# Patient Record
Sex: Male | Born: 1935 | Race: White | Hispanic: No | Marital: Married | Smoking: Never smoker
Health system: Southern US, Community
[De-identification: ages and names within clinical notes are randomized; demographics above are authoritative.]

## PROBLEM LIST (undated history)

## (undated) DIAGNOSIS — F419 Anxiety disorder, unspecified: Secondary | ICD-10-CM

## (undated) DIAGNOSIS — I2699 Other pulmonary embolism without acute cor pulmonale: Secondary | ICD-10-CM

## (undated) DIAGNOSIS — M75100 Unspecified rotator cuff tear or rupture of unspecified shoulder, not specified as traumatic: Secondary | ICD-10-CM

## (undated) DIAGNOSIS — N289 Disorder of kidney and ureter, unspecified: Secondary | ICD-10-CM

## (undated) DIAGNOSIS — M48061 Spinal stenosis, lumbar region without neurogenic claudication: Secondary | ICD-10-CM

## (undated) DIAGNOSIS — E785 Hyperlipidemia, unspecified: Secondary | ICD-10-CM

## (undated) DIAGNOSIS — T4145XA Adverse effect of unspecified anesthetic, initial encounter: Secondary | ICD-10-CM

## (undated) DIAGNOSIS — K649 Unspecified hemorrhoids: Secondary | ICD-10-CM

## (undated) DIAGNOSIS — F32A Depression, unspecified: Secondary | ICD-10-CM

## (undated) DIAGNOSIS — K219 Gastro-esophageal reflux disease without esophagitis: Secondary | ICD-10-CM

## (undated) DIAGNOSIS — I219 Acute myocardial infarction, unspecified: Secondary | ICD-10-CM

## (undated) DIAGNOSIS — Z87442 Personal history of urinary calculi: Secondary | ICD-10-CM

## (undated) DIAGNOSIS — F329 Major depressive disorder, single episode, unspecified: Secondary | ICD-10-CM

## (undated) DIAGNOSIS — R9431 Abnormal electrocardiogram [ECG] [EKG]: Secondary | ICD-10-CM

## (undated) DIAGNOSIS — T8859XA Other complications of anesthesia, initial encounter: Secondary | ICD-10-CM

## (undated) DIAGNOSIS — I10 Essential (primary) hypertension: Secondary | ICD-10-CM

## (undated) DIAGNOSIS — C61 Malignant neoplasm of prostate: Secondary | ICD-10-CM

## (undated) DIAGNOSIS — F0390 Unspecified dementia without behavioral disturbance: Secondary | ICD-10-CM

## (undated) DIAGNOSIS — Z89612 Acquired absence of left leg above knee: Secondary | ICD-10-CM

## (undated) DIAGNOSIS — M4846XA Fatigue fracture of vertebra, lumbar region, initial encounter for fracture: Secondary | ICD-10-CM

## (undated) HISTORY — PX: OTHER SURGICAL HISTORY: SHX169

## (undated) HISTORY — DX: Major depressive disorder, single episode, unspecified: F32.9

## (undated) HISTORY — PX: EYE SURGERY: SHX253

## (undated) HISTORY — DX: Malignant neoplasm of prostate: C61

## (undated) HISTORY — PX: HEMORRHOID SURGERY: SHX153

## (undated) HISTORY — PX: CHOLECYSTECTOMY: SHX55

## (undated) HISTORY — DX: Acute myocardial infarction, unspecified: I21.9

## (undated) HISTORY — PX: HERNIA REPAIR: SHX51

## (undated) HISTORY — PX: JOINT REPLACEMENT: SHX530

## (undated) HISTORY — DX: Depression, unspecified: F32.A

## (undated) HISTORY — DX: Hyperlipidemia, unspecified: E78.5

---

## 1965-08-31 DIAGNOSIS — Z89612 Acquired absence of left leg above knee: Secondary | ICD-10-CM

## 1965-08-31 HISTORY — PX: AMPUTATION: SHX166

## 1965-08-31 HISTORY — DX: Acquired absence of left leg above knee: Z89.612

## 1993-08-31 HISTORY — PX: PROSTATECTOMY: SHX69

## 1993-09-01 DIAGNOSIS — C61 Malignant neoplasm of prostate: Secondary | ICD-10-CM

## 1993-09-01 HISTORY — DX: Malignant neoplasm of prostate: C61

## 2005-04-20 ENCOUNTER — Inpatient Hospital Stay: Payer: Self-pay | Admitting: General Practice

## 2005-04-20 HISTORY — PX: OTHER SURGICAL HISTORY: SHX169

## 2005-04-25 ENCOUNTER — Encounter: Payer: Self-pay | Admitting: Internal Medicine

## 2005-05-01 ENCOUNTER — Encounter: Payer: Self-pay | Admitting: Internal Medicine

## 2005-05-31 ENCOUNTER — Encounter: Payer: Self-pay | Admitting: Internal Medicine

## 2005-07-01 ENCOUNTER — Encounter: Payer: Self-pay | Admitting: Internal Medicine

## 2005-07-31 ENCOUNTER — Encounter: Payer: Self-pay | Admitting: Internal Medicine

## 2005-08-31 ENCOUNTER — Encounter: Payer: Self-pay | Admitting: Internal Medicine

## 2005-10-01 ENCOUNTER — Encounter: Payer: Self-pay | Admitting: Internal Medicine

## 2005-10-29 ENCOUNTER — Encounter: Payer: Self-pay | Admitting: Internal Medicine

## 2005-11-29 ENCOUNTER — Encounter: Payer: Self-pay | Admitting: Internal Medicine

## 2005-12-29 ENCOUNTER — Encounter: Payer: Self-pay | Admitting: Internal Medicine

## 2006-01-29 ENCOUNTER — Encounter: Payer: Self-pay | Admitting: Internal Medicine

## 2006-02-28 ENCOUNTER — Encounter: Payer: Self-pay | Admitting: Internal Medicine

## 2006-03-31 ENCOUNTER — Encounter: Payer: Self-pay | Admitting: Internal Medicine

## 2006-05-01 ENCOUNTER — Encounter: Payer: Self-pay | Admitting: Internal Medicine

## 2006-05-11 ENCOUNTER — Emergency Department: Payer: Self-pay | Admitting: Emergency Medicine

## 2006-05-31 ENCOUNTER — Encounter: Payer: Self-pay | Admitting: Internal Medicine

## 2006-07-01 ENCOUNTER — Encounter: Payer: Self-pay | Admitting: Internal Medicine

## 2007-07-12 ENCOUNTER — Ambulatory Visit: Payer: Self-pay | Admitting: General Practice

## 2007-08-03 ENCOUNTER — Inpatient Hospital Stay: Payer: Self-pay | Admitting: General Practice

## 2007-08-04 ENCOUNTER — Other Ambulatory Visit: Payer: Self-pay

## 2007-08-05 ENCOUNTER — Other Ambulatory Visit: Payer: Self-pay

## 2007-08-10 ENCOUNTER — Encounter: Payer: Self-pay | Admitting: Internal Medicine

## 2007-08-21 ENCOUNTER — Other Ambulatory Visit: Payer: Self-pay

## 2007-08-21 ENCOUNTER — Inpatient Hospital Stay: Payer: Self-pay | Admitting: Internal Medicine

## 2008-08-03 ENCOUNTER — Ambulatory Visit: Payer: Self-pay | Admitting: General Practice

## 2008-08-31 DIAGNOSIS — M4846XA Fatigue fracture of vertebra, lumbar region, initial encounter for fracture: Secondary | ICD-10-CM

## 2008-08-31 HISTORY — DX: Fatigue fracture of vertebra, lumbar region, initial encounter for fracture: M48.46XA

## 2008-09-10 ENCOUNTER — Ambulatory Visit: Payer: Self-pay | Admitting: Anesthesiology

## 2008-10-01 ENCOUNTER — Ambulatory Visit: Payer: Self-pay | Admitting: Anesthesiology

## 2008-10-24 ENCOUNTER — Ambulatory Visit: Payer: Self-pay | Admitting: Anesthesiology

## 2008-12-13 ENCOUNTER — Ambulatory Visit: Payer: Self-pay | Admitting: Internal Medicine

## 2009-01-18 ENCOUNTER — Ambulatory Visit: Payer: Self-pay | Admitting: Anesthesiology

## 2009-03-26 ENCOUNTER — Ambulatory Visit: Payer: Self-pay | Admitting: Anesthesiology

## 2009-05-28 ENCOUNTER — Ambulatory Visit: Payer: Self-pay | Admitting: Anesthesiology

## 2011-03-31 ENCOUNTER — Encounter: Payer: Self-pay | Admitting: Physical Therapy

## 2011-04-02 ENCOUNTER — Ambulatory Visit: Payer: Medicare Other | Attending: Orthopedic Surgery | Admitting: Physical Therapy

## 2011-04-02 DIAGNOSIS — R5381 Other malaise: Secondary | ICD-10-CM | POA: Insufficient documentation

## 2011-04-02 DIAGNOSIS — IMO0001 Reserved for inherently not codable concepts without codable children: Secondary | ICD-10-CM | POA: Insufficient documentation

## 2011-04-02 DIAGNOSIS — R269 Unspecified abnormalities of gait and mobility: Secondary | ICD-10-CM | POA: Insufficient documentation

## 2011-04-09 ENCOUNTER — Encounter: Payer: Medicare Other | Admitting: Physical Therapy

## 2011-04-10 ENCOUNTER — Ambulatory Visit: Payer: Medicare Other | Admitting: Physical Therapy

## 2011-04-15 ENCOUNTER — Ambulatory Visit: Payer: Medicare Other | Admitting: Physical Therapy

## 2011-04-21 ENCOUNTER — Encounter: Payer: Medicare Other | Admitting: Physical Therapy

## 2011-04-23 ENCOUNTER — Ambulatory Visit: Payer: Medicare Other | Admitting: Physical Therapy

## 2011-04-28 ENCOUNTER — Encounter: Payer: Medicare Other | Admitting: Physical Therapy

## 2011-04-30 ENCOUNTER — Encounter: Payer: Medicare Other | Admitting: Physical Therapy

## 2011-05-05 ENCOUNTER — Encounter: Payer: Medicare Other | Admitting: Physical Therapy

## 2011-05-07 ENCOUNTER — Encounter: Payer: Medicare Other | Admitting: Physical Therapy

## 2011-05-12 ENCOUNTER — Ambulatory Visit: Payer: Medicare Other | Attending: Orthopedic Surgery | Admitting: Physical Therapy

## 2011-05-12 DIAGNOSIS — R5381 Other malaise: Secondary | ICD-10-CM | POA: Insufficient documentation

## 2011-05-12 DIAGNOSIS — S78119A Complete traumatic amputation at level between unspecified hip and knee, initial encounter: Secondary | ICD-10-CM | POA: Insufficient documentation

## 2011-05-12 DIAGNOSIS — IMO0001 Reserved for inherently not codable concepts without codable children: Secondary | ICD-10-CM | POA: Insufficient documentation

## 2011-05-12 DIAGNOSIS — R269 Unspecified abnormalities of gait and mobility: Secondary | ICD-10-CM | POA: Insufficient documentation

## 2011-05-19 ENCOUNTER — Ambulatory Visit: Payer: Medicare Other | Admitting: Physical Therapy

## 2011-05-26 ENCOUNTER — Ambulatory Visit: Payer: Medicare Other | Admitting: Physical Therapy

## 2011-06-02 ENCOUNTER — Ambulatory Visit: Payer: Medicare Other | Attending: Orthopedic Surgery | Admitting: Physical Therapy

## 2011-06-02 DIAGNOSIS — S78119A Complete traumatic amputation at level between unspecified hip and knee, initial encounter: Secondary | ICD-10-CM | POA: Insufficient documentation

## 2011-06-02 DIAGNOSIS — R269 Unspecified abnormalities of gait and mobility: Secondary | ICD-10-CM | POA: Insufficient documentation

## 2011-06-02 DIAGNOSIS — IMO0001 Reserved for inherently not codable concepts without codable children: Secondary | ICD-10-CM | POA: Insufficient documentation

## 2011-06-02 DIAGNOSIS — R5381 Other malaise: Secondary | ICD-10-CM | POA: Insufficient documentation

## 2011-06-16 ENCOUNTER — Ambulatory Visit: Payer: Medicare Other | Admitting: Physical Therapy

## 2011-06-30 ENCOUNTER — Ambulatory Visit: Payer: Medicare Other | Admitting: Physical Therapy

## 2011-07-14 ENCOUNTER — Ambulatory Visit: Payer: Medicare Other | Attending: Orthopedic Surgery | Admitting: Physical Therapy

## 2011-07-14 DIAGNOSIS — R269 Unspecified abnormalities of gait and mobility: Secondary | ICD-10-CM | POA: Insufficient documentation

## 2011-07-14 DIAGNOSIS — IMO0001 Reserved for inherently not codable concepts without codable children: Secondary | ICD-10-CM | POA: Insufficient documentation

## 2011-07-14 DIAGNOSIS — R5381 Other malaise: Secondary | ICD-10-CM | POA: Insufficient documentation

## 2011-07-14 DIAGNOSIS — S78119A Complete traumatic amputation at level between unspecified hip and knee, initial encounter: Secondary | ICD-10-CM | POA: Insufficient documentation

## 2011-07-29 ENCOUNTER — Encounter: Payer: Medicare Other | Admitting: Physical Therapy

## 2011-08-04 ENCOUNTER — Encounter: Payer: Medicare Other | Admitting: Physical Therapy

## 2013-01-18 ENCOUNTER — Ambulatory Visit: Payer: Self-pay | Admitting: Gastroenterology

## 2013-01-26 LAB — PATHOLOGY REPORT

## 2013-12-27 DIAGNOSIS — I10 Essential (primary) hypertension: Secondary | ICD-10-CM | POA: Insufficient documentation

## 2013-12-27 DIAGNOSIS — M4850XA Collapsed vertebra, not elsewhere classified, site unspecified, initial encounter for fracture: Secondary | ICD-10-CM | POA: Insufficient documentation

## 2013-12-27 DIAGNOSIS — K219 Gastro-esophageal reflux disease without esophagitis: Secondary | ICD-10-CM | POA: Insufficient documentation

## 2014-04-05 DIAGNOSIS — M48061 Spinal stenosis, lumbar region without neurogenic claudication: Secondary | ICD-10-CM | POA: Insufficient documentation

## 2014-09-25 DIAGNOSIS — Z8546 Personal history of malignant neoplasm of prostate: Secondary | ICD-10-CM | POA: Insufficient documentation

## 2015-12-25 ENCOUNTER — Ambulatory Visit
Admission: EM | Admit: 2015-12-25 | Discharge: 2015-12-25 | Disposition: A | Discharge status: Home / Self Care | Payer: Medicare Other | Attending: Family Medicine | Admitting: Family Medicine

## 2015-12-25 ENCOUNTER — Ambulatory Visit: Payer: Medicare Other

## 2015-12-25 DIAGNOSIS — S2243XA Multiple fractures of ribs, bilateral, initial encounter for closed fracture: Secondary | ICD-10-CM | POA: Diagnosis not present

## 2015-12-25 DIAGNOSIS — M4856XA Collapsed vertebra, not elsewhere classified, lumbar region, initial encounter for fracture: Secondary | ICD-10-CM | POA: Insufficient documentation

## 2015-12-25 DIAGNOSIS — X58XXXA Exposure to other specified factors, initial encounter: Secondary | ICD-10-CM | POA: Diagnosis not present

## 2015-12-25 DIAGNOSIS — R1013 Epigastric pain: Secondary | ICD-10-CM | POA: Diagnosis not present

## 2015-12-25 DIAGNOSIS — R109 Unspecified abdominal pain: Secondary | ICD-10-CM | POA: Insufficient documentation

## 2015-12-25 DIAGNOSIS — R110 Nausea: Secondary | ICD-10-CM

## 2015-12-25 HISTORY — DX: Gastro-esophageal reflux disease without esophagitis: K21.9

## 2015-12-25 HISTORY — DX: Essential (primary) hypertension: I10

## 2015-12-25 HISTORY — DX: Anxiety disorder, unspecified: F41.9

## 2015-12-25 MED ORDER — ONDANSETRON 4 MG PO TBDP
4.0000 mg | ORAL_TABLET | Freq: Once | ORAL | Status: AC
  Administered 2015-12-25: 4 mg via ORAL

## 2015-12-25 MED ORDER — ONDANSETRON 8 MG PO TBDP: 8.0000 mg | ORAL_TABLET | Freq: Once | ORAL | Status: DC

## 2015-12-25 MED ORDER — ONDANSETRON 8 MG PO TBDP: 8.0000 mg | ORAL_TABLET | Freq: Three times a day (TID) | ORAL | Status: DC | PRN

## 2015-12-25 MED ORDER — HYDROCODONE-ACETAMINOPHEN 5-325 MG PO TABS: ORAL_TABLET | ORAL | Status: DC

## 2015-12-25 NOTE — ED Provider Notes (Signed)
CSN: [id number]     Arrival date & time 12/25/15  1147 History   First MD Initiated Contact with Patient 12/25/15 1227     Chief Complaint  Patient presents with  . Nausea    Pt seen by PCP on Monday for same sx of nausea and anxiety. Placed Zoloft, Sucralfate, and Zofran. Not much  Has appt with GI on Monday. No diarrhea, no vomiting and stomach feels "raw". Denies pain.    (Consider location/radiation/quality/duration/timing/severity/associated sxs/prior Treatment) HPI Comments: 82 yo male with a complaint of stomach pains for several months and recent onset and continuation of nausea. Had a few isolated episodes of vomiting about one week ago.  Denies any diarrhea, fevers, chills, chest pains, shortness of breath, hematemesis. States saw PCP several days ago for this, given prescriptions for symptomatic treatment and referred to GI (has appt next Monday).   The history is provided by the patient.    Past Medical History  Diagnosis Date  . Anxiety   . Hypertension   . GERD (gastroesophageal reflux disease)    Past Surgical History  Procedure Laterality Date  . Cholecystectomy    . Joint replacement    . Prostatectomy     History reviewed. No pertinent family history. Social History  Substance Use Topics  . Smoking status: Never Smoker   . Smokeless tobacco: None  . Alcohol Use: No    Review of Systems  Allergies  Review of patient's allergies indicates no known allergies.  Home Medications   Prior to Admission medications   Medication Sig Start Date End Date Taking? Authorizing Provider  lisinopril (PRINIVIL,ZESTRIL) 30 MG tablet Take 30 mg by mouth daily.   Yes Historical Provider, MD  metoprolol (LOPRESSOR) 50 MG tablet Take 50 mg by mouth 2 (two) times daily.   Yes Historical Provider, MD  omeprazole (PRILOSEC) 20 MG capsule Take 20 mg by mouth daily.   Yes Historical Provider, MD  sertraline (ZOLOFT) 50 MG tablet Take 50 mg by mouth daily.   Yes Historical  Provider, MD  sucralfate (CARAFATE) 1 g tablet Take 1 g by mouth 4 (four) times daily -  with meals and at bedtime.   Yes Historical Provider, MD  HYDROcodone-acetaminophen (NORCO/VICODIN) 5-325 MG tablet 1-2 tabs po q 8 hours 12/25/15   Norval Gable, MD  ondansetron (ZOFRAN ODT) 8 MG disintegrating tablet Take 1 tablet (8 mg total) by mouth every 8 (eight) hours as needed for nausea or vomiting. 12/25/15   Norval Gable, MD   Meds Ordered and Administered this Visit   Medications  ondansetron (ZOFRAN-ODT) disintegrating tablet 4 mg (4 mg Oral Given 12/25/15 1312)    BP 135/70 mmHg  Pulse 85  Temp(Src) 97.8 F (36.6 C) (Tympanic)  Resp 18  Ht 5\' 8"  (1.727 m)  Wt 184 lb (83.462 kg)  BMI 27.98 kg/m2  SpO2 95% No data found.   Physical Exam  Constitutional: He is oriented to person, place, and time. He appears well-developed and well-nourished. No distress.  HENT:  Head: Normocephalic and atraumatic.  Cardiovascular: Normal rate, regular rhythm, normal heart sounds and intact distal pulses.   No murmur heard. Pulmonary/Chest: Effort normal and breath sounds normal. No respiratory distress. He has no wheezes. He has no rales.  Abdominal: Soft. Bowel sounds are normal. He exhibits no distension and no mass. There is tenderness (mild epigastric tenderness to palpation; no rebound, guarding or masses). There is no rebound and no guarding.  Neurological: He is alert and oriented to  person, place, and time.  Skin: No rash noted. He is not diaphoretic.  Nursing note and vitals reviewed.   ED Course  Procedures (including critical care time)  Labs Review Labs Reviewed - No data to display  Imaging Review Dg Abd 2 Views  12/25/2015  CLINICAL DATA:  82 year old male with generalized abdominal pain for 2 weeks and nausea. Bloating. Initial encounter. EXAM: ABDOMEN - 2 VIEW COMPARISON:  Lumbar MRI 08/03/2008. FINDINGS: Negative visualized lung bases.  No pneumoperitoneum. Non obstructed  bowel gas pattern.  Mild volume of retained stool. Osteopenia. Mild dextro convex scoliosis and multilevel mild lumbar compression fractures, age indeterminate chronic bilateral eighth and ninth rib fractures. Advanced degenerative changes at the visible right shoulder. Left hip arthroplasty. Sequelae of penile implant. IMPRESSION: 1.  Normal bowel gas pattern, no free air. 2. Multilevel lumbar compression fractures, likely chronic. Chronic bilateral lower rib fractures. Electronically Signed   By: Genevie Ann M.D.   On: 12/25/2015 13:37     Visual Acuity Review  Right Eye Distance:   Left Eye Distance:   Bilateral Distance:    Right Eye Near:   Left Eye Near:    Bilateral Near:         MDM   1. Dyspepsia   2. Nausea     Discharge Medication List as of 12/25/2015  2:37 PM    START taking these medications   Details  HYDROcodone-acetaminophen (NORCO/VICODIN) 5-325 MG tablet 1-2 tabs po q 8 hours, Print       1. Labs/x-ray results and diagnosis reviewed with patient and wife; patient given zofran 4mg  ODT x 1 in clinic with improvement of symptoms; tolerating po fluids prior to discharge 2. rx as per orders above; reviewed possible side effects, interactions, risks and benefits; also given rx for zofran  3. Recommend supportive treatment with clear liquids then advance slowly as tolerated 4. Follow-up prn if symptoms worsen or don't improve   Norval Gable, MD 12/25/15 1757

## 2015-12-27 ENCOUNTER — Ambulatory Visit
Admission: RE | Admit: 2015-12-27 | Discharge: 2015-12-27 | Disposition: A | Discharge status: Home / Self Care | Payer: Medicare Other | Source: Ambulatory Visit | Attending: Nurse Practitioner | Admitting: Nurse Practitioner

## 2015-12-27 ENCOUNTER — Other Ambulatory Visit
Admission: RE | Admit: 2015-12-27 | Discharge: 2015-12-27 | Disposition: A | Discharge status: Home / Self Care | Payer: Medicare Other | Source: Ambulatory Visit | Attending: Nurse Practitioner | Admitting: Nurse Practitioner

## 2015-12-27 ENCOUNTER — Other Ambulatory Visit: Payer: Self-pay | Admitting: Nurse Practitioner

## 2015-12-27 DIAGNOSIS — R110 Nausea: Secondary | ICD-10-CM | POA: Diagnosis present

## 2015-12-27 DIAGNOSIS — R112 Nausea with vomiting, unspecified: Secondary | ICD-10-CM | POA: Insufficient documentation

## 2015-12-27 DIAGNOSIS — R93422 Abnormal radiologic findings on diagnostic imaging of left kidney: Secondary | ICD-10-CM | POA: Diagnosis not present

## 2015-12-27 DIAGNOSIS — R1084 Generalized abdominal pain: Secondary | ICD-10-CM

## 2015-12-27 DIAGNOSIS — A09 Infectious gastroenteritis and colitis, unspecified: Secondary | ICD-10-CM | POA: Insufficient documentation

## 2015-12-27 DIAGNOSIS — Z9079 Acquired absence of other genital organ(s): Secondary | ICD-10-CM | POA: Diagnosis not present

## 2015-12-27 DIAGNOSIS — R1110 Vomiting, unspecified: Secondary | ICD-10-CM | POA: Insufficient documentation

## 2015-12-27 LAB — COMPREHENSIVE METABOLIC PANEL
ALBUMIN: 4.3 g/dL (ref 3.5–5.0)
ALT: 19 U/L (ref 17–63)
ANION GAP: 5 (ref 5–15)
AST: 22 U/L (ref 15–41)
Alkaline Phosphatase: 62 U/L (ref 38–126)
BUN: 10 mg/dL (ref 6–20)
CHLORIDE: 93 mmol/L — AB (ref 101–111)
CO2: 31 mmol/L (ref 22–32)
Calcium: 9.2 mg/dL (ref 8.9–10.3)
Creatinine, Ser: 0.74 mg/dL (ref 0.61–1.24)
GFR calc Af Amer: >60 mL/min (ref >60)
GFR calc non Af Amer: >60 mL/min (ref >60)
GLUCOSE: 110 mg/dL — AB (ref 65–99)
POTASSIUM: 3.8 mmol/L (ref 3.5–5.1)
SODIUM: 129 mmol/L — AB (ref 135–145)
Total Bilirubin: 0.9 mg/dL (ref 0.3–1.2)
Total Protein: 7.3 g/dL (ref 6.5–8.1)

## 2015-12-27 MED ORDER — IOPAMIDOL (ISOVUE-300) INJECTION 61%
100.0000 mL | Freq: Once | INTRAVENOUS | Status: AC | PRN
  Administered 2015-12-27: 100 mL via INTRAVENOUS

## 2015-12-30 DIAGNOSIS — Z89612 Acquired absence of left leg above knee: Secondary | ICD-10-CM | POA: Insufficient documentation

## 2015-12-30 LAB — C DIFFICILE QUICK SCREEN W PCR REFLEX
C Diff antigen: NEGATIVE
C Diff interpretation: NEGATIVE
C Diff toxin: NEGATIVE

## 2016-01-01 ENCOUNTER — Ambulatory Visit: Payer: Self-pay | Admitting: Urology

## 2016-01-03 ENCOUNTER — Encounter: Payer: Self-pay | Admitting: Urology

## 2016-01-03 ENCOUNTER — Ambulatory Visit (INDEPENDENT_AMBULATORY_CARE_PROVIDER_SITE_OTHER): Payer: Medicare Other | Admitting: Urology

## 2016-01-03 VITALS — BP 151/82 | HR 92 | Ht 68.0 in | Wt 184.0 lb

## 2016-01-03 DIAGNOSIS — N529 Male erectile dysfunction, unspecified: Secondary | ICD-10-CM

## 2016-01-03 DIAGNOSIS — N2889 Other specified disorders of kidney and ureter: Secondary | ICD-10-CM

## 2016-01-03 DIAGNOSIS — Z8546 Personal history of malignant neoplasm of prostate: Secondary | ICD-10-CM

## 2016-01-03 NOTE — Progress Notes (Signed)
01/03/2016 3:51 PM   Stephen Fields October 11, 1931 [id number]  Referring provider: Rusty Aus, MD [name] [place], [place] [address]  Chief Complaint  Patient presents with  . Renal mass (Right)    referred by [name]    HPI: The patient is an 82 year old gentleman with a past medical history significant for prostatectomy and penile prosthesis presents with a 2.1 cm lesion on the left lateral kidney that is concerning for renal cell carcinoma. CT scan was originally performed for nausea and vomiting. He has no left flank pain. He has never smoked. He has no history of hematuria.  He does have a history of prostate cancer Status post prostatectomy in 1995. His PSA was undetectable in May 2016.  He also underwent IPP placement.  PMH: Past Medical History  Diagnosis Date  . Anxiety   . Hypertension   . GERD (gastroesophageal reflux disease)   . Prostate cancer [facility])     Surgical History: Past Surgical History  Procedure Laterality Date  . Cholecystectomy    . Joint replacement    . Prostatectomy      1995  . Amputation      left leg    Home Medications:    Medication List       This list is accurate as of: 01/03/16  3:51 PM.  Always use your most recent med list.               amLODipine 5 MG tablet  Commonly known as:  NORVASC  Take by mouth.     HYDROcodone-acetaminophen 5-325 MG tablet  Commonly known as:  NORCO/VICODIN  1-2 tabs po q 8 hours     lisinopril 30 MG tablet  Commonly known as:  PRINIVIL,ZESTRIL  Take 30 mg by mouth daily.     lisinopril-hydrochlorothiazide 20-12.5 MG tablet  Commonly known as:  PRINZIDE,ZESTORETIC  Take by mouth. Reported on 01/03/2016     MELATONIN MAXIMUM STRENGTH 5 MG Tabs  Generic drug:  Melatonin  Take by mouth.     metoprolol 50 MG tablet  Commonly known as:  LOPRESSOR  Take 50 mg by mouth 2 (two) times daily.     metoprolol succinate 50 MG 24 hr tablet    Commonly known as:  TOPROL-XL  Take by mouth. Reported on 01/03/2016     multivitamin with minerals tablet  Take by mouth.     omeprazole 20 MG capsule  Commonly known as:  PRILOSEC  Take 20 mg by mouth daily.     ondansetron 8 MG disintegrating tablet  Commonly known as:  ZOFRAN ODT  Take 1 tablet (8 mg total) by mouth every 8 (eight) hours as needed for nausea or vomiting.     ondansetron 8 MG tablet  Commonly known as:  ZOFRAN  Take by mouth. Reported on 01/03/2016     PROCTOZONE-HC 2.5 % rectal cream  Generic drug:  hydrocortisone     RA NAPROXEN SODIUM 220 MG tablet  Generic drug:  naproxen sodium  Take by mouth.     sertraline 50 MG tablet  Commonly known as:  ZOLOFT  Take 50 mg by mouth daily.     sucralfate 1 g tablet  Commonly known as:  CARAFATE  Take 1 g by mouth 4 (four) times daily -  with meals and at bedtime. Reported on 01/03/2016        Allergies: No Known Allergies  Family History: Family History  Problem Relation Age of Onset  . Kidney disease Neg Hx   . Prostate cancer Father     Social History:  reports that he has never smoked. He does not have any smokeless tobacco history on file. He reports that he does not drink alcohol or use illicit drugs.  ROS: UROLOGY Frequent Urination?: No Hard to postpone urination?: Yes Burning/pain with urination?: No Get up at night to urinate?: Yes Leakage of urine?: Yes Urine stream starts and stops?: No Trouble starting stream?: No Do you have to strain to urinate?: No Blood in urine?: No Urinary tract infection?: No Sexually transmitted disease?: No Injury to kidneys or bladder?: No Painful intercourse?: No Weak stream?: No Erection problems?: No Penile pain?: No  Gastrointestinal Nausea?: Yes Vomiting?: Yes Indigestion/heartburn?: Yes Diarrhea?: Yes Constipation?: No  Constitutional Fever: No Night sweats?: No Weight loss?: Yes Fatigue?: Yes  Skin Skin rash/lesions?: No Itching?:  No  Eyes Blurred vision?: No Double vision?: No  Ears/Nose/Throat Sore throat?: No Sinus problems?: No  Hematologic/Lymphatic Swollen glands?: No Easy bruising?: No  Cardiovascular Leg swelling?: No Chest pain?: No  Respiratory Cough?: No Shortness of breath?: No  Endocrine Excessive thirst?: No  Musculoskeletal Back pain?: No Joint pain?: No  Neurological Headaches?: No Dizziness?: No  Psychologic Depression?: No Anxiety?: No  Physical Exam: BP 151/82 mmHg  Pulse 92  Ht 5\' 8"  (1.727 m)  Wt 184 lb (83.462 kg)  BMI 27.98 kg/m2  Constitutional:  Alert and oriented, No acute distress. HEENT: [place] AT, moist mucus membranes.  Trachea midline, no masses. Cardiovascular: No clubbing, cyanosis, or edema. Respiratory: Normal respiratory effort, no increased work of breathing. GI: Abdomen is soft, nontender, nondistended, no abdominal masses GU: No CVA tenderness.  Skin: No rashes, bruises or suspicious lesions. Lymph: No cervical or inguinal adenopathy. Neurologic: Grossly intact, no focal deficits, moving all 4 extremities. Psychiatric: Normal mood and affect.  Laboratory Data: No results found for: WBC, HGB, HCT, MCV, PLT  Lab Results  Component Value Date   CREATININE 0.74 12/27/2015    No results found for: PSA  No results found for: TESTOSTERONE  No results found for: HGBA1C  Urinalysis No results found for: COLORURINE, APPEARANCEUR, LABSPEC, PHURINE, GLUCOSEU, HGBUR, BILIRUBINUR, KETONESUR, PROTEINUR, UROBILINOGEN, NITRITE, LEUKOCYTESUR  Pertinent Imaging: CLINICAL DATA: Abdominal pain, nausea/ vomiting, prior  cholecystectomy and prostatectomy  EXAM:  CT ABDOMEN AND PELVIS WITH CONTRAST  TECHNIQUE:  Multidetector CT imaging of the abdomen and pelvis was performed  using the standard protocol following bolus administration of  intravenous contrast.  CONTRAST: 128mL ISOVUE-300 IOPAMIDOL (ISOVUE-300) INJECTION 61%  COMPARISON: None.   FINDINGS:  Lower chest: Lung bases are clear.  Coronary atherosclerosis.  Hepatobiliary: Dominant 6.8 cm cyst in the left hepatic dome.  Additional scattered hepatic cysts, measuring up to 1.6 cm in the  lateral segment left hepatic lobe (series 2/ image 19).  Status post cholecystectomy. No intrahepatic or extrahepatic ductal  dilatation.  Pancreas: Within normal limits.  Spleen: Within normal limits.  Adrenals/Urinary Tract: Adrenal glands are within normal limits.  1.9 x 2.1 x 1.9 cm enhancing lateral left lower pole renal lesion  (series 2/image 40), suspicious for solid renal neoplasm such as  renal cell carcinoma.  Additional 8 mm probable cyst in the anterior left upper kidney  (series 2/ image 33). 11 mm cyst in the anterior right upper kidney  (series 2/ image 29). No hydronephrosis.  Bladder is partially obscured by streak artifact but grossly  unremarkable.  Stomach/Bowel: Stomach is  notable for a small hiatal hernia.  No evidence of bowel obstruction.  Normal appendix (series 2/ image 59).  Vascular/Lymphatic: Atherosclerotic calcifications of the abdominal  aorta and branch vessels. No evidence of abdominal aortic aneurysm.  Single left renal artery and vein. No renal vein invasion.  No suspicious abdominopelvic lymphadenopathy.  Reproductive: Status post prostatectomy.  Penile prosthesis. Deflated reservoir within the left lower anterior  abdominal wall (series 2/ image 71).  Other: No abdominopelvic ascites.  Musculoskeletal: Left hip arthroplasty, in satisfactory position.  Degenerative changes of the visualized thoracolumbar spine, most  prominent at L5-S1. Mild to moderate loss of height involving the  superior endplate at L3.  No focal osseous lesions.  IMPRESSION:  2.1 cm enhancing lesion along the left lower kidney, suspicious for  solid renal neoplasm such as renal cell carcinoma. Single left renal  artery and vein. No renal vein invasion. No regional   lymphadenopathy.  No evidence of bowel obstruction. Normal appendix.  Status post prostatectomy. No evidence of recurrent or metastatic  disease.  Penile prosthesis. Reservoir is within the left lower anterior  abdominal wall.   Assessment & Plan:    1. Left renal mass I had a long discussion with the patient regarding treatment options for his 2 cm left renal mass. Is exophytic in the lateral portion of the kidney. We discussed active surveillance, robotic partial nephrectomy, and cryoablation. We discussed the risks, benefits, indications of each. After approximately 30 to conversation, the patient has elected to undergo cryoablation interventional radiology. If this is a very reasonable option given his age and tumor location. We will arrange for him to have this procedure done by interventional radiology. He will follow-up afterwards for biopsy results as well as to check on his progress.  2. History of prostate cancer Patient is scheduled to have his PSA checked next week for his annual physical with his PCP.  3. Erectile Dysfunction -continue IPP   No Follow-up on file.  Nickie Retort, MD  [facility] [address] [place], [place] [address] [phone]

## 2016-01-04 LAB — URINALYSIS, COMPLETE
Bilirubin, UA: NEGATIVE
Glucose, UA: NEGATIVE
KETONES UA: NEGATIVE
Leukocytes, UA: NEGATIVE
NITRITE UA: NEGATIVE
Protein, UA: NEGATIVE
RBC UA: NEGATIVE
SPEC GRAV UA: 1.02 (ref 1.005–1.030)
Urobilinogen, Ur: 0.2 mg/dL (ref 0.2–1.0)
pH, UA: 7 (ref 5.0–7.5)

## 2016-01-04 LAB — MICROSCOPIC EXAMINATION: Bacteria, UA: NONE SEEN

## 2016-01-08 ENCOUNTER — Ambulatory Visit
Admission: RE | Admit: 2016-01-08 | Discharge: 2016-01-08 | Disposition: A | Discharge status: Home / Self Care | Payer: Medicare Other | Source: Ambulatory Visit | Attending: Urology | Admitting: Urology

## 2016-01-08 DIAGNOSIS — N2889 Other specified disorders of kidney and ureter: Secondary | ICD-10-CM

## 2016-01-08 HISTORY — PX: IR GENERIC HISTORICAL: IMG1180011

## 2016-01-08 NOTE — Consult Note (Signed)
Chief Complaint: Indeterminate left sided renal lesion  Referring Physician(s): Nickie Retort  History of Present Illness: Stephen Fields is a 82 y.o. 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 The patient presents to the interventional radiology clinic for potential percutaneous management of this indeterminate renal lesion. The patient is accompanied by his wife and son though serves as his own historian.  The patient has no symptoms attributable to this incidentally discovered right sided renal lesion. Specifically, no flank pain or hematuria. No unintentional weight loss.  Patient states that since the acquisition of the abdominal CT scan, his abdominal pain and nausea has improved.  Patient denies chest pain. No shortness of breath though states he is chronically short of breath with activity. Patient emulates around his house with a walker though utilizes a wheelchair for long distance mobility. No fever or chills.   Past Medical History  Diagnosis Date  . Anxiety   . Hypertension   . GERD (gastroesophageal reflux disease)   . Prostate cancer ([place])   . Depression   . Heart attack ([place])   . HLD (hyperlipidemia)     Past Surgical History  Procedure Laterality Date  . Cholecystectomy    . Joint replacement  2000    Hip  . Prostatectomy      1995  . Amputation      left leg    Allergies: Review of patient's allergies indicates no known allergies.  Medications: Prior to Admission medications   Medication Sig Start Date End Date Taking? Authorizing Provider   amLODipine (NORVASC) 5 MG tablet Take by mouth. 01/14/15  Yes Historical Provider, MD  hydrocortisone (PROCTOZONE-HC) 2.5 % rectal cream  07/24/15  Yes Historical Provider, MD  lisinopril-hydrochlorothiazide (PRINZIDE,ZESTORETIC) 20-12.5 MG tablet Take by mouth. Reported on 01/03/2016 01/14/15  Yes Historical Provider, MD  Melatonin (MELATONIN MAXIMUM STRENGTH) 5 MG TABS Take by mouth.   Yes Historical Provider, MD  metoprolol (LOPRESSOR) 50 MG tablet Take 50 mg by mouth 2 (two) times daily.   Yes Historical Provider, MD  Multiple Vitamins-Minerals (MULTIVITAMIN WITH MINERALS) tablet Take by mouth.   Yes Historical Provider, MD  omeprazole (PRILOSEC) 20 MG capsule Take 20 mg by mouth daily.   Yes Historical Provider, MD  sertraline (ZOLOFT) 50 MG tablet Take 50 mg by mouth daily.   Yes Historical Provider, MD  sucralfate (CARAFATE) 1 g tablet Take 1 g by mouth 4 (four) times daily -  with meals and at bedtime. Reported on 01/03/2016   Yes Historical Provider, MD  HYDROcodone-acetaminophen (NORCO/VICODIN) 5-325 MG tablet 1-2 tabs po q 8 hours Patient not taking: Reported on 01/08/2016 12/25/15   Norval Gable, MD  lisinopril (PRINIVIL,ZESTRIL) 30 MG tablet Take 30 mg by mouth daily.    Historical Provider, MD  metoprolol succinate (TOPROL-XL) 50 MG 24 hr tablet Take by mouth. Reported on 01/03/2016 01/14/15   Historical Provider, MD  naproxen sodium (RA NAPROXEN SODIUM) 220 MG tablet Take by mouth. Reported on 01/08/2016    Historical Provider, MD  ondansetron (ZOFRAN ODT) 8 MG disintegrating tablet Take 1 tablet (8 mg total) by mouth  every 8 (eight) hours as needed for nausea or vomiting. Patient not taking: Reported on 01/08/2016 12/25/15   Norval Gable, MD  ondansetron (ZOFRAN) 8 MG tablet Take by mouth. Reported on 01/03/2016    Historical Provider, MD     Family History  Problem Relation Age of Onset  . Kidney disease Neg Hx   . Prostate cancer Father   . Bladder Cancer      Social History    Social History  . Marital Status: Married    Spouse Name: N/A  . Number of Children: N/A  . Years of Education: N/A   Social History Main Topics  . Smoking status: Never Smoker   . Smokeless tobacco: Not on file  . Alcohol Use: No  . Drug Use: No  . Sexual Activity: Not on file   Other Topics Concern  . Not on file   Social History Narrative    ECOG Status: 2 - Symptomatic, <50% confined to bed  Review of Systems: A 12 point ROS discussed and pertinent positives are indicated in the HPI above.  All other systems are negative.  Review of Systems  Constitutional: Negative for fever, chills, activity change, appetite change and fatigue.  Respiratory: Positive for shortness of breath.        Chronic shortness of breath with activity.  Gastrointestinal: Positive for nausea and abdominal pain.  Skin: Negative.   Psychiatric/Behavioral: Negative.     Vital Signs: BP 131/73 mmHg  Pulse 80  Temp(Src) 98.3 F (36.8 C) (Oral)  Resp 14  Ht 5\' 8"  (1.727 m)  Wt 184 lb (83.462 kg)  BMI 27.98 kg/m2  SpO2 96%  Physical Exam  Constitutional: He appears well-developed and well-nourished.  HENT:  Head: Normocephalic and atraumatic.  Cardiovascular: Normal rate and regular rhythm.   Pulmonary/Chest: Effort normal and breath sounds normal.  Musculoskeletal:       Legs: Left lower extremity traumatic amputation.  Skin: Skin is warm and dry.  Psychiatric: He has a normal mood and affect. His behavior is normal.  Nursing note and vitals reviewed.   Imaging:  CT scan performed 12/27/2015 demonstrates an indeterminate heterogeneously enhancing approximately 2.5 x 2.1 x 1.9 cm partially exophytic lesion arising from the mid/inferior lateral aspect of the left kidney. There appears to be approximately 7 mm between the medial margin of this lesion and the adjacent left renal collecting system.  The descending colon and left lateral flank and body wall is interposed with a moderate  amount of pararenal fat.  Ct Abdomen Pelvis W Contrast  12/27/2015  CLINICAL DATA:  Abdominal pain, nausea/ vomiting, prior cholecystectomy and prostatectomy EXAM: CT ABDOMEN AND PELVIS WITH CONTRAST TECHNIQUE: Multidetector CT imaging of the abdomen and pelvis was performed using the standard protocol following bolus administration of intravenous contrast. CONTRAST:  153mL ISOVUE-300 IOPAMIDOL (ISOVUE-300) INJECTION 61% COMPARISON:  None. FINDINGS: Lower chest:  Lung bases are clear. Coronary atherosclerosis. Hepatobiliary: Dominant 6.8 cm cyst in the left hepatic dome. Additional scattered hepatic cysts, measuring up to 1.6 cm in the lateral segment left hepatic lobe (series 2/ image 19). Status post cholecystectomy. No intrahepatic or extrahepatic ductal dilatation. Pancreas: Within normal limits. Spleen: Within normal limits. Adrenals/Urinary Tract: Adrenal glands are within normal limits. 1.9 x 2.1 x 1.9 cm enhancing lateral left lower pole renal lesion (series 2/image 40), suspicious for solid renal neoplasm such as renal cell carcinoma. Additional 8 mm probable cyst in the anterior left upper kidney (series 2/ image 33). 11 mm cyst  in the anterior right upper kidney (series 2/ image 29). No hydronephrosis. Bladder is partially obscured by streak artifact but grossly unremarkable. Stomach/Bowel: Stomach is notable for a small hiatal hernia. No evidence of bowel obstruction. Normal appendix (series 2/ image 59). Vascular/Lymphatic: Atherosclerotic calcifications of the abdominal aorta and branch vessels. No evidence of abdominal aortic aneurysm. Single left renal artery and vein.  No renal vein invasion. No suspicious abdominopelvic lymphadenopathy. Reproductive: Status post prostatectomy. Penile prosthesis. Deflated reservoir within the left lower anterior abdominal wall (series 2/ image 71). Other: No abdominopelvic ascites. Musculoskeletal:   Left hip arthroplasty, in satisfactory position. Degenerative  changes of the visualized thoracolumbar spine, most prominent at L5-S1. Mild to moderate loss of height involving the superior endplate at L3. No focal osseous lesions. IMPRESSION: 2.1 cm enhancing lesion along the left lower kidney, suspicious for solid renal neoplasm such as renal cell carcinoma. Single left renal artery and vein. No renal vein invasion. No regional lymphadenopathy. No evidence of bowel obstruction.  Normal appendix. Status post prostatectomy. No evidence of recurrent or metastatic disease. Penile prosthesis. Reservoir is within the left lower anterior abdominal wall. Electronically Signed   By: Julian Hy M.D.   On: 12/27/2015 16:49   Dg Abd 2 Views  12/25/2015  CLINICAL DATA:  82 year old male with generalized abdominal pain for 2 weeks and nausea. Bloating. Initial encounter. EXAM: ABDOMEN - 2 VIEW COMPARISON:  Lumbar MRI 08/03/2008. FINDINGS: Negative visualized lung bases.  No pneumoperitoneum. Non obstructed bowel gas pattern.  Mild volume of retained stool. Osteopenia. Mild dextro convex scoliosis and multilevel mild lumbar compression fractures, age indeterminate chronic bilateral eighth and ninth rib fractures. Advanced degenerative changes at the visible right shoulder. Left hip arthroplasty. Sequelae of penile implant. IMPRESSION: 1.  Normal bowel gas pattern, no free air. 2. Multilevel lumbar compression fractures, likely chronic. Chronic bilateral lower rib fractures. Electronically Signed   By: Genevie Ann M.D.   On: 12/25/2015 13:37    Labs:  CBC: No results for input(s): WBC, HGB, HCT, PLT in the last 8760 hours.  COAGS: No results for input(s): INR, APTT in the last 8760 hours.  BMP:  Recent Labs  12/27/15 1612  NA 129*  K 3.8  CL 93*  CO2 31  GLUCOSE 110*  BUN 10  CALCIUM 9.2  CREATININE 0.74  GFRNONAA >60  GFRAA >60    LIVER FUNCTION TESTS:  Recent Labs  12/27/15 1612  BILITOT 0.9  AST 22  ALT 19  ALKPHOS 62  PROT 7.3  ALBUMIN 4.3     TUMOR MARKERS: No results for input(s): AFPTM, CEA, CA199, CHROMGRNA in the last 8760 hours.  Assessment and Plan:  Stephen Fields is a 82 y.o. 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   The patient has no symptoms attributable to this incidentally discovered right sided renal lesion.   Review of CT scan CT scan performed 12/27/2015 demonstrates this approximately 2.5 partially exophytic lesion arising from the mid/inferior lateral aspect of the left kidney would be amenable to CT-guided percutaneous ablation and potential biopsy.  The patient was initially seen in consultation by Dr. Pilar Jarvis, who  discussed potential treatment options including surveillance, partial nephrectomy and CT-guided cryoablation.    All of these options were again reviewed with the patient in detail. Following this prolonged and detailed discussion, the patient wishes to pursue CT-guided renal cryoablation and potential biopsy.  As such, risks and benefits a CT-guided renal cryoablation were discussed with the patient including, but not limited to failure to treat entire lesion, bleeding, infection, damage to adjacent structures, decrease in renal function or post procedural neuropathy.    All of the patient's and the patient's family's questions were answered, patient is agreeable to proceed.  This procedural we will be performed at [facility] at the next available anesthesia slot.    As the patient states he experienced a "mild" heart attack either during or after surgery for a left hip replacement,  cardiac clearance is warranted.  The procedure will entail an overnight admission for continued observation and PCA usage.  Thank you for this interesting consult.  I greatly enjoyed meeting Stephen Fields and look forward to participating in their care.  A copy of this report was sent to the requesting provider on this date.  Electronically Signed: Sandi Mariscal 01/08/2016, 2:30 PM   I spent a total of 30 Minutes in face to face in clinical consultation, greater than 50% of which was counseling/coordinating care for indeterminate renal lesion

## 2016-01-13 ENCOUNTER — Other Ambulatory Visit: Payer: Self-pay | Admitting: Interventional Radiology

## 2016-01-13 DIAGNOSIS — N289 Disorder of kidney and ureter, unspecified: Secondary | ICD-10-CM

## 2016-01-16 ENCOUNTER — Other Ambulatory Visit: Payer: Self-pay | Admitting: General Surgery

## 2016-01-20 DIAGNOSIS — Z01810 Encounter for preprocedural cardiovascular examination: Secondary | ICD-10-CM | POA: Insufficient documentation

## 2016-01-21 NOTE — Progress Notes (Signed)
Cardiac clearance [facility] dr Saralyn Pilar 01-20-16 [facility] cardiology on chart Stress test with resting ekg 01-17-16 [facility] cardiology on chart

## 2016-01-21 NOTE — Patient Instructions (Addendum)
SASHA HAUER  01/21/2016   Your procedure is scheduled on: 01-24-16  Report to [facility]  Entrance take [facility]  elevators to 3rd floor to  [place] at  1000 AM.  Call this number if you have problems the morning of surgery [phone]   Remember: ONLY 1 PERSON MAY GO WITH YOU TO SHORT STAY TO GET  READY MORNING OF [facility].  Do not eat food or drink liquids :After Midnight.     Take these medicines the morning of surgery with A SIP OF WATER: AMLODIPINE (NORVASC), METOPROLOL SUCCINATE, OMEPRAZOLE (PRILOSEC), SERTRALINE (ZOLOFT)                               You may not have any metal on your body including hair pins and              piercings  Do not wear jewelry, make-up, lotions, powders or perfumes, deodorant             Do not wear nail polish.  Do not shave  48 hours prior to surgery.              Men may shave face and neck.   Do not bring valuables to the hospital. [place].  Contacts, dentures or bridgework may not be worn into surgery.  Leave suitcase in the car. After surgery it may be brought to your room.                  Please read over the following fact sheets you were given: _____________________________________________________________________             [facility] - Preparing for Surgery Before surgery, you can play an important role.  Because skin is not sterile, your skin needs to be as free of germs as possible.  You can reduce the number of germs on your skin by washing with CHG (chlorahexidine gluconate) soap before surgery.  CHG is an antiseptic cleaner which kills germs and bonds with the skin to continue killing germs even after washing. Please DO NOT use if you have an allergy to CHG or antibacterial soaps.  If your skin becomes reddened/irritated stop using the CHG and inform your nurse when you arrive at Short Stay. Do not shave (including legs and underarms)  for at least 48 hours prior to the first CHG shower.  You may shave your face/neck. Please follow these instructions carefully:  1.  Shower with CHG Soap the night before surgery and the  morning of Surgery.  2.  If you choose to wash your hair, wash your hair first as usual with your  normal  shampoo.  3.  After you shampoo, rinse your hair and body thoroughly to remove the  shampoo.                           4.  Use CHG as you would any other liquid soap.  You can apply chg directly  to the skin and wash                       Gently with a scrungie or clean washcloth.  5.  Apply the CHG Soap to your body ONLY FROM THE NECK DOWN.   Do not use on face/ open                           Wound or open sores. Avoid contact with eyes, ears mouth and genitals (private parts).                       Wash face,  Genitals (private parts) with your normal soap.             6.  Wash thoroughly, paying special attention to the area where your surgery  will be performed.  7.  Thoroughly rinse your body with warm water from the neck down.  8.  DO NOT shower/wash with your normal soap after using and rinsing off  the CHG Soap.                9.  Pat yourself dry with a clean towel.            10.  Wear clean pajamas.            11.  Place clean sheets on your bed the night of your first shower and do not  sleep with pets. Day of Surgery : Do not apply any lotions/deodorants the morning of surgery.  Please wear clean clothes to the hospital/surgery center.  FAILURE TO FOLLOW THESE INSTRUCTIONS MAY RESULT IN THE CANCELLATION OF YOUR SURGERY PATIENT SIGNATURE_________________________________  NURSE SIGNATURE__________________________________  ________________________________________________________________________

## 2016-01-22 ENCOUNTER — Encounter (HOSPITAL_COMMUNITY)
Admission: RE | Admit: 2016-01-22 | Discharge: 2016-01-22 | Disposition: A | Discharge status: Home / Self Care | Payer: Medicare Other | Source: Ambulatory Visit | Attending: Interventional Radiology | Admitting: Interventional Radiology

## 2016-01-22 ENCOUNTER — Encounter (HOSPITAL_COMMUNITY): Payer: Self-pay

## 2016-01-22 ENCOUNTER — Other Ambulatory Visit: Payer: Self-pay | Admitting: Radiology

## 2016-01-22 DIAGNOSIS — N2889 Other specified disorders of kidney and ureter: Secondary | ICD-10-CM | POA: Diagnosis present

## 2016-01-22 DIAGNOSIS — Z96642 Presence of left artificial hip joint: Secondary | ICD-10-CM | POA: Diagnosis not present

## 2016-01-22 DIAGNOSIS — I10 Essential (primary) hypertension: Secondary | ICD-10-CM | POA: Diagnosis not present

## 2016-01-22 DIAGNOSIS — Z79891 Long term (current) use of opiate analgesic: Secondary | ICD-10-CM | POA: Diagnosis not present

## 2016-01-22 DIAGNOSIS — Z8546 Personal history of malignant neoplasm of prostate: Secondary | ICD-10-CM | POA: Diagnosis not present

## 2016-01-22 DIAGNOSIS — Z96651 Presence of right artificial knee joint: Secondary | ICD-10-CM | POA: Diagnosis not present

## 2016-01-22 HISTORY — DX: Abnormal electrocardiogram (ECG) (EKG): R94.31

## 2016-01-22 HISTORY — DX: Adverse effect of unspecified anesthetic, initial encounter: T41.45XA

## 2016-01-22 HISTORY — DX: Spinal stenosis, lumbar region without neurogenic claudication: M48.061

## 2016-01-22 HISTORY — DX: Unspecified hemorrhoids: K64.9

## 2016-01-22 HISTORY — DX: Other pulmonary embolism without acute cor pulmonale: I26.99

## 2016-01-22 HISTORY — DX: Disorder of kidney and ureter, unspecified: N28.9

## 2016-01-22 HISTORY — DX: Personal history of urinary calculi: Z87.442

## 2016-01-22 HISTORY — DX: Other complications of anesthesia, initial encounter: T88.59XA

## 2016-01-22 HISTORY — DX: Fatigue fracture of vertebra, lumbar region, initial encounter for fracture: M48.46XA

## 2016-01-22 HISTORY — DX: Unspecified rotator cuff tear or rupture of unspecified shoulder, not specified as traumatic: M75.100

## 2016-01-22 LAB — BASIC METABOLIC PANEL
ANION GAP: 6 (ref 5–15)
BUN: 11 mg/dL (ref 6–20)
CHLORIDE: 99 mmol/L — AB (ref 101–111)
CO2: 31 mmol/L (ref 22–32)
CREATININE: 0.73 mg/dL (ref 0.61–1.24)
Calcium: 8.9 mg/dL (ref 8.9–10.3)
GFR calc non Af Amer: >60 mL/min (ref >60)
Glucose, Bld: 103 mg/dL — ABNORMAL HIGH (ref 65–99)
Potassium: 4 mmol/L (ref 3.5–5.1)
Sodium: 136 mmol/L (ref 135–145)

## 2016-01-22 LAB — APTT: aPTT: 29 seconds (ref 24–37)

## 2016-01-22 LAB — PROTIME-INR
INR: 0.95 (ref 0.00–1.49)
Prothrombin Time: 12.9 seconds (ref 11.6–15.2)

## 2016-01-22 LAB — CBC
HEMATOCRIT: 40.3 % (ref 39.0–52.0)
HEMOGLOBIN: 13.6 g/dL (ref 13.0–17.0)
MCH: 30.4 pg (ref 26.0–34.0)
MCHC: 33.7 g/dL (ref 30.0–36.0)
MCV: 90 fL (ref 78.0–100.0)
Platelets: 422 10*3/uL — ABNORMAL HIGH (ref 150–400)
RBC: 4.48 MIL/uL (ref 4.22–5.81)
RDW: 13 % (ref 11.5–15.5)
WBC: 8.5 10*3/uL (ref 4.0–10.5)

## 2016-01-23 LAB — ABO/RH: ABO/RH(D): O POS

## 2016-01-24 ENCOUNTER — Ambulatory Visit (HOSPITAL_COMMUNITY)
Admission: RE | Admit: 2016-01-24 | Discharge: 2016-01-24 | Disposition: A | Discharge status: Home / Self Care | Payer: Medicare Other | Source: Ambulatory Visit | Attending: Interventional Radiology | Admitting: Interventional Radiology

## 2016-01-24 ENCOUNTER — Ambulatory Visit (HOSPITAL_COMMUNITY): Payer: Medicare Other | Admitting: Anesthesiology

## 2016-01-24 ENCOUNTER — Encounter (HOSPITAL_COMMUNITY)
Admission: RE | Disposition: A | Discharge status: Home / Self Care | Payer: Self-pay | Source: Ambulatory Visit | Attending: Interventional Radiology

## 2016-01-24 ENCOUNTER — Ambulatory Visit (HOSPITAL_COMMUNITY): Payer: Medicare Other

## 2016-01-24 ENCOUNTER — Encounter (HOSPITAL_COMMUNITY): Payer: Self-pay

## 2016-01-24 ENCOUNTER — Observation Stay (HOSPITAL_COMMUNITY)
Admission: RE | Admit: 2016-01-24 | Discharge: 2016-01-25 | Disposition: A | Discharge status: Home / Self Care | Payer: Medicare Other | Source: Ambulatory Visit | Attending: Interventional Radiology | Admitting: Interventional Radiology

## 2016-01-24 ENCOUNTER — Encounter (HOSPITAL_COMMUNITY): Payer: Self-pay | Admitting: General Practice

## 2016-01-24 DIAGNOSIS — Z8546 Personal history of malignant neoplasm of prostate: Secondary | ICD-10-CM | POA: Insufficient documentation

## 2016-01-24 DIAGNOSIS — I10 Essential (primary) hypertension: Secondary | ICD-10-CM | POA: Diagnosis not present

## 2016-01-24 DIAGNOSIS — Z79891 Long term (current) use of opiate analgesic: Secondary | ICD-10-CM | POA: Diagnosis not present

## 2016-01-24 DIAGNOSIS — Z96651 Presence of right artificial knee joint: Secondary | ICD-10-CM | POA: Diagnosis not present

## 2016-01-24 DIAGNOSIS — Z96642 Presence of left artificial hip joint: Secondary | ICD-10-CM | POA: Diagnosis not present

## 2016-01-24 DIAGNOSIS — N289 Disorder of kidney and ureter, unspecified: Secondary | ICD-10-CM

## 2016-01-24 DIAGNOSIS — N2889 Other specified disorders of kidney and ureter: Principal | ICD-10-CM | POA: Diagnosis present

## 2016-01-24 DIAGNOSIS — Z01818 Encounter for other preprocedural examination: Secondary | ICD-10-CM

## 2016-01-24 LAB — TYPE AND SCREEN
ABO/RH(D): O POS
ANTIBODY SCREEN: NEGATIVE

## 2016-01-24 SURGERY — RADIO FREQUENCY ABLATION
Anesthesia: General | Laterality: Left

## 2016-01-24 MED ORDER — METOPROLOL SUCCINATE ER 50 MG PO TB24
50.0000 mg | ORAL_TABLET | Freq: Every day | ORAL | Status: DC
  Administered 2016-01-25: 50 mg via ORAL
  Filled 2016-01-24: qty 1

## 2016-01-24 MED ORDER — SUGAMMADEX SODIUM 200 MG/2ML IV SOLN
INTRAVENOUS | Status: DC | PRN
  Administered 2016-01-24: 200 mg via INTRAVENOUS

## 2016-01-24 MED ORDER — SERTRALINE HCL 50 MG PO TABS
50.0000 mg | ORAL_TABLET | Freq: Every day | ORAL | Status: DC
  Administered 2016-01-25: 50 mg via ORAL
  Filled 2016-01-24: qty 1

## 2016-01-24 MED ORDER — FENTANYL CITRATE (PF) 100 MCG/2ML IJ SOLN: 25.0000 ug | INTRAMUSCULAR | Status: DC | PRN

## 2016-01-24 MED ORDER — ROCURONIUM BROMIDE 100 MG/10ML IV SOLN
INTRAVENOUS | Status: DC | PRN
  Administered 2016-01-24: 40 mg via INTRAVENOUS
  Administered 2016-01-24: 20 mg via INTRAVENOUS

## 2016-01-24 MED ORDER — AMLODIPINE BESYLATE 5 MG PO TABS
5.0000 mg | ORAL_TABLET | Freq: Every day | ORAL | Status: DC
  Administered 2016-01-25: 5 mg via ORAL
  Filled 2016-01-24: qty 1

## 2016-01-24 MED ORDER — FENTANYL CITRATE (PF) 100 MCG/2ML IJ SOLN
INTRAMUSCULAR | Status: DC | PRN
  Administered 2016-01-24: 50 ug via INTRAVENOUS

## 2016-01-24 MED ORDER — PHENYLEPHRINE HCL 10 MG/ML IJ SOLN
INTRAMUSCULAR | Status: DC | PRN
  Administered 2016-01-24 (×3): 80 ug via INTRAVENOUS

## 2016-01-24 MED ORDER — LIDOCAINE HCL (CARDIAC) 20 MG/ML IV SOLN
INTRAVENOUS | Status: DC | PRN
  Administered 2016-01-24: 100 mg via INTRAVENOUS

## 2016-01-24 MED ORDER — DOCUSATE SODIUM 100 MG PO CAPS
100.0000 mg | ORAL_CAPSULE | Freq: Two times a day (BID) | ORAL | Status: DC
  Administered 2016-01-25: 100 mg via ORAL
  Filled 2016-01-24 (×4): qty 1

## 2016-01-24 MED ORDER — ONDANSETRON HCL 4 MG/2ML IJ SOLN: 4.0000 mg | Freq: Four times a day (QID) | INTRAMUSCULAR | Status: DC | PRN

## 2016-01-24 MED ORDER — FENTANYL CITRATE (PF) 100 MCG/2ML IJ SOLN
INTRAMUSCULAR | Status: AC
  Filled 2016-01-24: qty 4

## 2016-01-24 MED ORDER — SODIUM CHLORIDE 0.9% FLUSH: 9.0000 mL | INTRAVENOUS | Status: DC | PRN

## 2016-01-24 MED ORDER — DIPHENHYDRAMINE HCL 12.5 MG/5ML PO ELIX
12.5000 mg | ORAL_SOLUTION | Freq: Four times a day (QID) | ORAL | Status: DC | PRN
  Filled 2016-01-24: qty 5

## 2016-01-24 MED ORDER — LACTATED RINGERS IV SOLN
INTRAVENOUS | Status: DC
  Administered 2016-01-24 (×2): via INTRAVENOUS

## 2016-01-24 MED ORDER — DIPHENHYDRAMINE HCL 50 MG/ML IJ SOLN: 12.5000 mg | Freq: Four times a day (QID) | INTRAMUSCULAR | Status: DC | PRN

## 2016-01-24 MED ORDER — HYDROCHLOROTHIAZIDE 12.5 MG PO CAPS
12.5000 mg | ORAL_CAPSULE | Freq: Every day | ORAL | Status: DC
  Administered 2016-01-25: 12.5 mg via ORAL
  Filled 2016-01-24: qty 1

## 2016-01-24 MED ORDER — ACETAMINOPHEN 325 MG PO TABS
650.0000 mg | ORAL_TABLET | Freq: Four times a day (QID) | ORAL | Status: DC | PRN
  Administered 2016-01-24 – 2016-01-25 (×2): 650 mg via ORAL
  Filled 2016-01-24 (×2): qty 2

## 2016-01-24 MED ORDER — PROPOFOL 10 MG/ML IV BOLUS
INTRAVENOUS | Status: DC | PRN
  Administered 2016-01-24: 120 mg via INTRAVENOUS
  Administered 2016-01-24 (×2): 20 mg via INTRAVENOUS

## 2016-01-24 MED ORDER — HYDROMORPHONE 1 MG/ML IV SOLN
INTRAVENOUS | Status: DC
  Administered 2016-01-24: 16:00:00 via INTRAVENOUS
  Administered 2016-01-24: 0.2 mg via INTRAVENOUS
  Administered 2016-01-24: 0.4 mg via INTRAVENOUS
  Administered 2016-01-25: 0.2 mg via INTRAVENOUS
  Administered 2016-01-25: 0.4 mg via INTRAVENOUS

## 2016-01-24 MED ORDER — LISINOPRIL-HYDROCHLOROTHIAZIDE 20-12.5 MG PO TABS: 1.0000 | ORAL_TABLET | Freq: Every day | ORAL | Status: DC

## 2016-01-24 MED ORDER — LISINOPRIL 20 MG PO TABS
20.0000 mg | ORAL_TABLET | Freq: Every day | ORAL | Status: DC
  Administered 2016-01-25: 20 mg via ORAL
  Filled 2016-01-24: qty 1

## 2016-01-24 MED ORDER — ONDANSETRON HCL 4 MG/2ML IJ SOLN
INTRAMUSCULAR | Status: DC | PRN
  Administered 2016-01-24: 4 mg via INTRAVENOUS

## 2016-01-24 MED ORDER — PANTOPRAZOLE SODIUM 40 MG PO TBEC
40.0000 mg | DELAYED_RELEASE_TABLET | Freq: Every day | ORAL | Status: DC
  Administered 2016-01-25: 40 mg via ORAL
  Filled 2016-01-24: qty 1

## 2016-01-24 MED ORDER — NALOXONE HCL 0.4 MG/ML IJ SOLN: 0.4000 mg | INTRAMUSCULAR | Status: DC | PRN

## 2016-01-24 MED ORDER — HYDROCORTISONE 2.5 % RE CREA: 1.0000 "application " | TOPICAL_CREAM | Freq: Every day | RECTAL | Status: DC | PRN

## 2016-01-24 MED ORDER — ROCURONIUM BROMIDE 100 MG/10ML IV SOLN
INTRAVENOUS | Status: AC
  Filled 2016-01-24: qty 1

## 2016-01-24 MED ORDER — DEXAMETHASONE SODIUM PHOSPHATE 10 MG/ML IJ SOLN
INTRAMUSCULAR | Status: DC | PRN
  Administered 2016-01-24: 10 mg via INTRAVENOUS

## 2016-01-24 MED ORDER — EPHEDRINE SULFATE 50 MG/ML IJ SOLN
INTRAMUSCULAR | Status: DC | PRN
  Administered 2016-01-24 (×2): 5 mg via INTRAVENOUS
  Administered 2016-01-24: 10 mg via INTRAVENOUS

## 2016-01-24 MED ORDER — CEFAZOLIN SODIUM-DEXTROSE 2-4 GM/100ML-% IV SOLN
2.0000 g | INTRAVENOUS | Status: AC
  Administered 2016-01-24: 2 g via INTRAVENOUS
  Filled 2016-01-24: qty 100

## 2016-01-24 MED ORDER — PHENYLEPHRINE HCL 10 MG/ML IJ SOLN
10.0000 mg | INTRAMUSCULAR | Status: DC | PRN
  Administered 2016-01-24: 50 ug/min via INTRAVENOUS

## 2016-01-24 MED ORDER — HYDROMORPHONE 1 MG/ML IV SOLN
INTRAVENOUS | Status: AC
  Filled 2016-01-24: qty 25

## 2016-01-24 NOTE — Anesthesia Preprocedure Evaluation (Addendum)
Anesthesia Evaluation  Patient identified by MRN, date of birth, ID band Patient awake    Reviewed: Allergy & Precautions, NPO status , Patient's Chart, lab work & pertinent test results  History of Anesthesia Complications (+) history of anesthetic complications  Airway Mallampati: II  TM Distance: >3 FB Neck ROM: Full    Dental no notable dental hx.    Pulmonary neg pulmonary ROS,    Pulmonary exam normal breath sounds clear to auscultation       Cardiovascular Exercise Tolerance: Good hypertension, Pt. on medications and Pt. on home beta blockers + Past MI  Normal cardiovascular exam Rhythm:Regular Rate:Normal  Intermittent chest pain. Clearance cardiologist Dr. Josefa Half ([place]). "Low acceptable risk"   Neuro/Psych PSYCHIATRIC DISORDERS Anxiety Depression negative neurological ROS     GI/Hepatic Neg liver ROS, GERD  Medicated,  Endo/Other  negative endocrine ROS  Renal/GU Renal disease  negative genitourinary   Musculoskeletal negative musculoskeletal ROS (+)   Abdominal   Peds negative pediatric ROS (+)  Hematology negative hematology ROS (+)   Anesthesia Other Findings   Reproductive/Obstetrics negative OB ROS                         Anesthesia Physical Anesthesia Plan  ASA: III  Anesthesia Plan: General   Post-op Pain Management:    Induction: Intravenous  Airway Management Planned: Oral ETT  Additional Equipment:   Intra-op Plan:   Post-operative Plan: Extubation in OR  Informed Consent: I have reviewed the patients History and Physical, chart, labs and discussed the procedure including the risks, benefits and alternatives for the proposed anesthesia with the patient or authorized representative who has indicated his/her understanding and acceptance.   Dental advisory given  Plan Discussed with: CRNA  Anesthesia Plan Comments:        Anesthesia Quick  Evaluation

## 2016-01-24 NOTE — Progress Notes (Signed)
Patient ID: Stephen Fields, male   DOB: [birth year], 82 y.o.   MRN: [id number]    Referring Physician(s): Budzyn,B  Supervising Physician: Sandi Mariscal  Patient Status: In-pt  Chief Complaint:  Left renal mass  Subjective:  Pt doing well; has some mild left flank discomfort; denies abd pain,N/V, dyspnea or hematuria; s/p left renal mass bx/cryoablation today  Allergies: Review of patient's allergies indicates no known allergies.  Medications: Prior to Admission medications   Medication Sig Start Date End Date Taking? Authorizing Provider  amLODipine (NORVASC) 5 MG tablet Take 5 mg by mouth daily.  01/14/15  Yes Historical Provider, MD  hydrocortisone (PROCTOZONE-HC) 2.5 % rectal cream Place 1 application rectally daily as needed (Applies to leg.).  07/24/15  Yes Historical Provider, MD  lisinopril-hydrochlorothiazide (PRINZIDE,ZESTORETIC) 20-12.5 MG tablet Take 1 tablet by mouth daily. 12/30/15  Yes Historical Provider, MD  Melatonin (MELATONIN MAXIMUM STRENGTH) 5 MG TABS Take 10 mg by mouth at bedtime.    Yes Historical Provider, MD  metoprolol succinate (TOPROL-XL) 50 MG 24 hr tablet Take 50 mg by mouth daily.  01/14/15  Yes Historical Provider, MD  Multiple Vitamins-Minerals (MULTIVITAMIN WITH MINERALS) tablet Take 1 tablet by mouth daily.    Yes Historical Provider, MD  omeprazole (PRILOSEC) 20 MG capsule Take 20 mg by mouth daily.   Yes Historical Provider, MD  ondansetron (ZOFRAN ODT) 8 MG disintegrating tablet Take 1 tablet (8 mg total) by mouth every 8 (eight) hours as needed for nausea or vomiting. 12/25/15  Yes Norval Gable, MD  sertraline (ZOLOFT) 50 MG tablet Take 50 mg by mouth daily.   Yes Historical Provider, MD  HYDROcodone-acetaminophen (NORCO/VICODIN) 5-325 MG tablet 1-2 tabs po q 8 hours Patient taking differently: Take 1-2 tablets by mouth every 8 (eight) hours as needed (For pain.).  12/25/15   Norval Gable, MD     Vital Signs: BP 131/73 mmHg  Pulse 74   Temp(Src) 97.8 F (36.6 C)  Resp 16  Ht 5\' 8"  (1.727 m)  Wt 180 lb (81.647 kg)  BMI 27.38 kg/m2  SpO2 97%  Physical Exam awake/alert; puncture sites left flank clean and dry, NT; no distinct hematoma; foley cath draining yellow urine  Imaging: Dg Chest 1 View  01/24/2016  CLINICAL DATA:  Pre op; no chest complaints today; HTN; non smoker; hx prostate CA; EXAM: CHEST 1 VIEW COMPARISON:  08/21/2007 FINDINGS: Heart size is normal. Lungs are clear. There are no focal consolidations or effusions no pulmonary edema. Chronic changes are identified in the shoulders bilaterally, right greater than left. Spondylosis of the thoracic spine. IMPRESSION: No active disease. Electronically Signed   By: Nolon Nations M.D.   On: 01/24/2016 10:47   Ct Guide Tissue Ablation  01/24/2016  CLINICAL DATA:  History of enhancing exophytic approximately 2.1 cm enhancing left renal mass worrisome for renal cell carcinoma. Patient presents today for CT-guided cryoablation and percutaneous biopsy. EXAM: 1. CT-GUIDED LEFT RENAL CRYOABLATION 2. CT-GUIDED LEFT RENAL LESION BIOPSY ANESTHESIA/SEDATION: General MEDICATIONS: Ancef 2 gm IV .The antibiotic was administered in an appropriate time interval prior to needle puncture of the skin. CONTRAST:  None PROCEDURE: The procedure, risks, benefits, and alternatives were explained to the patient. Questions regarding the procedure were encouraged and answered. The patient understands and consents to the procedure. The patient was placed under general anesthesia. Initial un-enhanced CT was performed in a prone position to localize the grossly unchanged slightly ill-defined approximately 1.7 x 2.3 cm partially exophytic left-sided renal lesion (image 18,  series 2). The skin overlying the left flank was prepped with Betadine in a sterile fashion, and a sterile drape was applied covering the operative field. A sterile gown and sterile gloves were used for the procedure. Under CT guidance,  two 1.5 cx ice rod percutaneous cryoablation probes were advanced into the cranial and caudal aspects of the partially exophytic renal lesion under intermittent CT imaging. Once both probes were appropriate position, there were locked in place. Next, utilizing a slightly oblique angulation, a 17 gauge trocar needle was advanced into the central aspect of the ill-defined lesion under intermittent CT imaging. Appropriate positioning was confirmed and a solitary core needle biopsy was obtained with an 18 gauge core needle biopsy device. Initial 10 minute cycle of cryoablation was performed. This was followed by a 5 minute thaw cycle. A second 10 minute cycle of cryoablation was then performed. During ablation, periodic CT imaging was performed to monitor ice ball formation and morphology. After active thaw, the cryoablation probes were removed following tract ablation. Post-procedural CT was performed. COMPLICATIONS: None immediate. FINDINGS: Preprocedural imaging demonstrates unchanged size and appearance of the approximately 2.3 cm partially exophytic left-sided renal lesion. Percutaneous cryoablation probes were advanced into the cranial and caudal aspects of the renal lesion. Once appropriate positioning was obtained, a percutaneous core needle biopsy was obtained with an 18 gauge core needle biopsy device. Next, the cryo ablation was performed. Intra procedural imaging demonstrated excellent appearance of the ice ball encompassing the entirety of the exophytic renal lesion without evidence of extension to involve the left renal collecting system, body wall or adjacent portion of the descending colon. Postprocedural imaging demonstrates a minimal amount of expected postprocedural hemorrhage about the left flank access site without evidence of additional complication. IMPRESSION: CT guided percutaneous core biopsy and cryoablation of indeterminate exophytic left renal lesion worrisome for renal cell carcinoma. The  patient will be observed overnight. PLAN: The patient will return to the interventional radiology clinic in approximately 3-4 weeks with repeat creatinine level. Initial postprocedural surveillance imaging will be obtained in approximately 3 months. Electronically Signed   By: Sandi Mariscal M.D.   On: 01/24/2016 15:58   Ct Biopsy  01/24/2016  CLINICAL DATA:  History of enhancing exophytic approximately 2.1 cm enhancing left renal mass worrisome for renal cell carcinoma. Patient presents today for CT-guided cryoablation and percutaneous biopsy. EXAM: 1. CT-GUIDED LEFT RENAL CRYOABLATION 2. CT-GUIDED LEFT RENAL LESION BIOPSY ANESTHESIA/SEDATION: General MEDICATIONS: Ancef 2 gm IV .The antibiotic was administered in an appropriate time interval prior to needle puncture of the skin. CONTRAST:  None PROCEDURE: The procedure, risks, benefits, and alternatives were explained to the patient. Questions regarding the procedure were encouraged and answered. The patient understands and consents to the procedure. The patient was placed under general anesthesia. Initial un-enhanced CT was performed in a prone position to localize the grossly unchanged slightly ill-defined approximately 1.7 x 2.3 cm partially exophytic left-sided renal lesion (image 18, series 2). The skin overlying the left flank was prepped with Betadine in a sterile fashion, and a sterile drape was applied covering the operative field. A sterile gown and sterile gloves were used for the procedure. Under CT guidance, two 1.5 cx ice rod percutaneous cryoablation probes were advanced into the cranial and caudal aspects of the partially exophytic renal lesion under intermittent CT imaging. Once both probes were appropriate position, there were locked in place. Next, utilizing a slightly oblique angulation, a 17 gauge trocar needle was advanced into the central aspect of  the ill-defined lesion under intermittent CT imaging. Appropriate positioning was confirmed and  a solitary core needle biopsy was obtained with an 18 gauge core needle biopsy device. Initial 10 minute cycle of cryoablation was performed. This was followed by a 5 minute thaw cycle. A second 10 minute cycle of cryoablation was then performed. During ablation, periodic CT imaging was performed to monitor ice ball formation and morphology. After active thaw, the cryoablation probes were removed following tract ablation. Post-procedural CT was performed. COMPLICATIONS: None immediate. FINDINGS: Preprocedural imaging demonstrates unchanged size and appearance of the approximately 2.3 cm partially exophytic left-sided renal lesion. Percutaneous cryoablation probes were advanced into the cranial and caudal aspects of the renal lesion. Once appropriate positioning was obtained, a percutaneous core needle biopsy was obtained with an 18 gauge core needle biopsy device. Next, the cryo ablation was performed. Intra procedural imaging demonstrated excellent appearance of the ice ball encompassing the entirety of the exophytic renal lesion without evidence of extension to involve the left renal collecting system, body wall or adjacent portion of the descending colon. Postprocedural imaging demonstrates a minimal amount of expected postprocedural hemorrhage about the left flank access site without evidence of additional complication. IMPRESSION: CT guided percutaneous core biopsy and cryoablation of indeterminate exophytic left renal lesion worrisome for renal cell carcinoma. The patient will be observed overnight. PLAN: The patient will return to the interventional radiology clinic in approximately 3-4 weeks with repeat creatinine level. Initial postprocedural surveillance imaging will be obtained in approximately 3 months. Electronically Signed   By: Sandi Mariscal M.D.   On: 01/24/2016 15:58    Labs:  CBC:  Recent Labs  01/22/16 1137  WBC 8.5  HGB 13.6  HCT 40.3  PLT 422*    COAGS:  Recent Labs   01/22/16 1137  INR 0.95  APTT 29    BMP:  Recent Labs  12/27/15 1612 01/22/16 1137  NA 129* 136  K 3.8 4.0  CL 93* 99*  CO2 31 31  GLUCOSE 110* 103*  BUN 10 11  CALCIUM 9.2 8.9  CREATININE 0.74 0.73  GFRNONAA >60 >60  GFRAA >60 >60    LIVER FUNCTION TESTS:  Recent Labs  12/27/15 1612  BILITOT 0.9  AST 22  ALT 19  ALKPHOS 62  PROT 7.3  ALBUMIN 4.3    Assessment and Plan: S/p cryoablation/bx left renal mass 5/26; for overnight obs; PCA dilaudid for pain if needed; check am labs; f/u with Dr. Pascal Lux in IR clinic in 3-4 weeks with BMP check; f/u path report   Electronically Signed: D. Rowe Robert 01/24/2016, 4:54 PM   I spent a total of 15 minutes at the the patient's bedside AND on the patient's hospital floor or unit, greater than 50% of which was counseling/coordinating care for left renal mass biopsy/cryoablation

## 2016-01-24 NOTE — Procedures (Signed)
Technically successful CT guided ablation and biopsy of exophytic left renal mass.  EBL: Minimal No immediate post procedural complications.   Ronny Bacon, MD Pager #: [phone]

## 2016-01-24 NOTE — Anesthesia Postprocedure Evaluation (Signed)
Anesthesia Post Note  Patient: Stephen Fields  Procedure(s) Performed: Procedure(s) (LRB): LEFT RENAL CRYO ABLATION (Left)  Patient location during evaluation: PACU Anesthesia Type: General Level of consciousness: awake and alert Pain management: pain level controlled Vital Signs Assessment: post-procedure vital signs reviewed and stable Respiratory status: spontaneous breathing, nonlabored ventilation, respiratory function stable and patient connected to nasal cannula oxygen Cardiovascular status: blood pressure returned to baseline and stable Postop Assessment: no signs of nausea or vomiting Anesthetic complications: no Comments: Moves all extremities to commands.    Last Vitals:  Filed Vitals:   01/24/16 1545 01/24/16 1610  BP: 110/64 131/73  Pulse: 71 74  Temp: 36.5 C 36.6 C  Resp: 18 16    Last Pain:  Filed Vitals:   01/24/16 1648  PainSc: 2                  Khaza Blansett J

## 2016-01-24 NOTE — Transfer of Care (Signed)
Immediate Anesthesia Transfer of Care Note  Patient: Stephen Fields  Procedure(s) Performed: Procedure(s): LEFT RENAL CRYO ABLATION (Left)  Patient Location: PACU  Anesthesia Type:General  Level of Consciousness: sedated  Airway & Oxygen Therapy: Patient Spontanous Breathing and Patient connected to face mask oxygen  Post-op Assessment: Report given to RN and Post -op Vital signs reviewed and stable  Post vital signs: Reviewed and stable  Last Vitals: There were no vitals filed for this visit.  Last Pain: There were no vitals filed for this visit.       Complications: No apparent anesthesia complications
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
## 2016-01-24 NOTE — Anesthesia Procedure Notes (Signed)
Procedure Name: Intubation Date/Time: 01/24/2016 12:10 PM Performed by: Lind Covert Pre-anesthesia Checklist: Patient identified, Emergency Drugs available, Suction available, Patient being monitored and Timeout performed Patient Re-evaluated:Patient Re-evaluated prior to inductionOxygen Delivery Method: Circle system utilized Preoxygenation: Pre-oxygenation with 100% oxygen Intubation Type: IV induction Ventilation: Mask ventilation without difficulty Laryngoscope Size: Miller and 3 Grade View: Grade I Tube type: Oral Tube size: 7.5 mm Number of attempts: 1 Airway Equipment and Method: Stylet Secured at: 22 cm Tube secured with: Tape Dental Injury: Teeth and Oropharynx as per pre-operative assessment

## 2016-01-25 ENCOUNTER — Other Ambulatory Visit: Payer: Self-pay | Admitting: Radiology

## 2016-01-25 DIAGNOSIS — N2889 Other specified disorders of kidney and ureter: Secondary | ICD-10-CM | POA: Diagnosis not present

## 2016-01-25 LAB — CBC WITH DIFFERENTIAL/PLATELET
BASOS PCT: 0 %
Basophils Absolute: 0 10*3/uL (ref 0.0–0.1)
Eosinophils Absolute: 0 10*3/uL (ref 0.0–0.7)
Eosinophils Relative: 0 %
HEMATOCRIT: 35.2 % — AB (ref 39.0–52.0)
HEMOGLOBIN: 12 g/dL — AB (ref 13.0–17.0)
LYMPHS ABS: 1 10*3/uL (ref 0.7–4.0)
Lymphocytes Relative: 7 %
MCH: 31.1 pg (ref 26.0–34.0)
MCHC: 34.1 g/dL (ref 30.0–36.0)
MCV: 91.2 fL (ref 78.0–100.0)
MONOS PCT: 12 %
Monocytes Absolute: 1.6 10*3/uL — ABNORMAL HIGH (ref 0.1–1.0)
NEUTROS ABS: 11.3 10*3/uL — AB (ref 1.7–7.7)
Neutrophils Relative %: 81 %
Platelets: 375 10*3/uL (ref 150–400)
RBC: 3.86 MIL/uL — AB (ref 4.22–5.81)
RDW: 13.3 % (ref 11.5–15.5)
WBC: 13.9 10*3/uL — AB (ref 4.0–10.5)

## 2016-01-25 LAB — BASIC METABOLIC PANEL
Anion gap: 5 (ref 5–15)
BUN: 13 mg/dL (ref 6–20)
CHLORIDE: 102 mmol/L (ref 101–111)
CO2: 31 mmol/L (ref 22–32)
Calcium: 8.3 mg/dL — ABNORMAL LOW (ref 8.9–10.3)
Creatinine, Ser: 0.78 mg/dL (ref 0.61–1.24)
GFR calc Af Amer: >60 mL/min (ref >60)
GFR calc non Af Amer: >60 mL/min (ref >60)
GLUCOSE: 114 mg/dL — AB (ref 65–99)
POTASSIUM: 3.7 mmol/L (ref 3.5–5.1)
Sodium: 138 mmol/L (ref 135–145)

## 2016-01-25 NOTE — Discharge Instructions (Signed)
Kidney Biopsy/Cryoablation, Care After Refer to this sheet in the next few weeks. These instructions provide you with information on caring for yourself after your procedure. Your health care provider may also give you more specific instructions. Your treatment has been planned according to current medical practices, but problems sometimes occur. Call your health care provider if you have any problems or questions after your procedure.  WHAT TO EXPECT AFTER THE PROCEDURE   You may notice blood in the urine for the first 24 hours after the biopsy.  You may feel some pain at the biopsy site for 1-2 weeks after the biopsy. HOME CARE INSTRUCTIONS  Do not lift anything heavier than 10 lb (4.5 kg) for 2 weeks.  Do not take any non-steroidal anti-inflammatory drugs (NSAIDs) or any blood thinners for a week after the biopsy unless instructed to do so by your health care provider.  Only take medicines for pain, fever, or discomfort as directed by your health care provider. SEEK MEDICAL CARE IF:  You have bloody urine more than 24 hours after the biopsy.   You develop a fever.   You cannot urinate.   You have increasing pain at the biopsy site.  SEEK IMMEDIATE MEDICAL CARE IF: You feel faint or dizzy.    This information is not intended to replace advice given to you by your health care provider. Make sure you discuss any questions you have with your health care provider.   Document Released: 04/19/2013 Document Reviewed: 04/19/2013 Elsevier Interactive Patient Education [employer].

## 2016-01-25 NOTE — Progress Notes (Addendum)
Foley removed at 2300 per IR order. Pt given urinal and will let us know when he first voids. IVF expired, but turned down to [facility] as a carrier fluid for the PCA. Hortencia Conradi RN

## 2016-01-25 NOTE — Discharge Summary (Signed)
Patient ID: Stephen Fields MRN: [id number] DOB/AGE: 12/08/1931 82 y.o.  Admit date: 01/24/2016 Discharge date: 01/25/2016  Supervising Physician: Sandi Mariscal  Admission Diagnoses: Left renal mass  Discharge Diagnoses: Left renal mass, status post CT-guided percutaneous cryoablation and biopsy via general anesthesia on 01/24/16 Active Problems:   Renal mass, left  Past Medical History  Diagnosis Date  . Anxiety   . Hypertension   . GERD (gastroesophageal reflux disease)   . Depression   . HLD (hyperlipidemia)   . Heart attack ([place]) 2003 OR 2004    WITH LEFT HIP REPLACMENT SURGERY  . History of kidney stones     X 80YRS AGO  . Hemorrhoids     HX OF  . Lumbar stenosis   . Prostate cancer ([place]) Sep 01, 1993  . Lumbar stress fracture 2010    L2 TO L3   . Pulmonary embolus ([place]) 2003 OR 2004    AFTER HIP REPLACEMENT  . Abnormal EKG     HX OF RIGHT BUNDLE BRANCH BLOCK  . Complication of anesthesia 2003 OR 2004    MI AFTER HIP REPLACMENT SURGERY  . Rotator cuff tear SEVERAL YRS AGO, STILL HAS    RIGHT  . Renal lesion     LEFT   Past Surgical History  Procedure Laterality Date  . Prostatectomy  JAN 1995  . Amputation      left leg ABOVE KNEE  . Hemorrhoid surgery  YRS AGO  . Testicle hernia repair  40 OR 78 YRS AGO  . Lumbar injections to back  3 YRS AGO  . Joint replacement Left 2003 OR 2004    Hip  . Right knee replacement  04-20-2005  . Cholecystectomy  YRS AGO      Discharged Condition: good  Hospital Course: Stephen Fields is an 82 year old male, patient of Dr.Budzyn, who was referred to the interventional radiology service recently for further evaluation/treatment options for an incidental 2.5 cm left lower pole renal mass noted on prior CT.  Following consultation with Dr. Pascal Lux he was deemed an appropriate candidate for CT-guided percutaneous cryoablation and biopsy of the renal mass. On 01/24/16 he underwent successful CT-guided percutaneous cryoablation and  biopsy of the left renal mass via general anesthesia. The procedure was performed without immediate complications. Post procedure he was admitted for overnight observation. Overnight the patient did well with the only exception of minimal left flank discomfort and occasional headache. He was placed on Dilaudid PCA pump. On the morning of discharge the patient was stable. He did have a mild headache but no significant left flank discomfort. He was able to void but did experience some mild dysuria initially. There was no hematuria. He was able to ambulate and tolerate his diet without difficulty. Follow-up creatinine was normal, hemoglobin 12 and WBC 13.9 (most likely reactive from ablation). Vital signs were stable and he was afebrile. Case was discussed with Dr. Laurence Ferrari and he was deemed stable for discharge at this time. He will follow-up with Dr. Pascal Lux in the interventional radiology clinic with BMP check in 3-4 weeks. He will continue his current home medications. He was told to contact our service in the interim with any additional questions or concerns.  Consults: none  Significant Diagnostic Studies:  Results for orders placed or performed during the hospital encounter of 01/24/16  CBC with Differential/Platelet  Result Value Ref Range   WBC 13.9 (H) 4.0 - 10.5 K/uL   RBC 3.86 (L) 4.22 - 5.81 MIL/uL  Hemoglobin 12.0 (L) 13.0 - 17.0 g/dL   HCT 35.2 (L) 39.0 - 52.0 %   MCV 91.2 78.0 - 100.0 fL   MCH 31.1 26.0 - 34.0 pg   MCHC 34.1 30.0 - 36.0 g/dL   RDW 13.3 11.5 - 15.5 %   Platelets 375 150 - 400 K/uL   Neutrophils Relative % 81 %   Neutro Abs 11.3 (H) 1.7 - 7.7 K/uL   Lymphocytes Relative 7 %   Lymphs Abs 1.0 0.7 - 4.0 K/uL   Monocytes Relative 12 %   Monocytes Absolute 1.6 (H) 0.1 - 1.0 K/uL   Eosinophils Relative 0 %   Eosinophils Absolute 0.0 0.0 - 0.7 K/uL   Basophils Relative 0 %   Basophils Absolute 0.0 0.0 - 0.1 K/uL  Basic metabolic panel  Result Value Ref Range    Sodium 138 135 - 145 mmol/L   Potassium 3.7 3.5 - 5.1 mmol/L   Chloride 102 101 - 111 mmol/L   CO2 31 22 - 32 mmol/L   Glucose, Bld 114 (H) 65 - 99 mg/dL   BUN 13 6 - 20 mg/dL   Creatinine, Ser 0.78 0.61 - 1.24 mg/dL   Calcium 8.3 (L) 8.9 - 10.3 mg/dL   GFR calc non Af Amer >60 >60 mL/min   GFR calc Af Amer >60 >60 mL/min   Anion gap 5 5 - 15    Treatments: CT-guided percutaneous cryoablation and biopsy of left renal mass via general anesthesia on 01/24/16  Discharge Exam: Blood pressure 134/86, pulse 82, temperature 98.2 F (36.8 C), temperature source Oral, resp. rate 17, height 5\' 8"  (1.727 m), weight 180 lb (81.647 kg), SpO2 98 %. Patient awake, alert. Chest clear to auscultation bilaterally. Heart with regular rate and rhythm. Abdomen soft, positive bowel sounds, nontender. Puncture sites left flank clean, dry, nontender; right lower extremity with trace edema, left AKA.  Disposition: home  Discharge Instructions    Call MD for:  difficulty breathing, headache or visual disturbances    Complete by:  As directed      Call MD for:  extreme fatigue    Complete by:  As directed      Call MD for:  hives    Complete by:  As directed      Call MD for:  persistant dizziness or light-headedness    Complete by:  As directed      Call MD for:  persistant nausea and vomiting    Complete by:  As directed      Call MD for:  redness, tenderness, or signs of infection (pain, swelling, redness, odor or green/yellow discharge around incision site)    Complete by:  As directed      Call MD for:  severe uncontrolled pain    Complete by:  As directed      Call MD for:  temperature >100.4    Complete by:  As directed      Change dressing (specify)    Complete by:  As directed   May change bandages over left flank region daily for the next 2-3 days. May wash site with soap and water.     Diet - low sodium heart healthy    Complete by:  As directed      Driving Restrictions    Complete by:   As directed   No driving for next 48 hours     Increase activity slowly    Complete by:  As directed  Lifting restrictions    Complete by:  As directed   No heavy lifting for next 3-4 days     May shower / Bathe    Complete by:  As directed      May walk up steps    Complete by:  As directed             Medication List    TAKE these medications        amLODipine 5 MG tablet  Commonly known as:  NORVASC  Take 5 mg by mouth daily.     HYDROcodone-acetaminophen 5-325 MG tablet  Commonly known as:  NORCO/VICODIN  1-2 tabs po q 8 hours     lisinopril-hydrochlorothiazide 20-12.5 MG tablet  Commonly known as:  PRINZIDE,ZESTORETIC  Take 1 tablet by mouth daily.     MELATONIN MAXIMUM STRENGTH 5 MG Tabs  Generic drug:  Melatonin  Take 10 mg by mouth at bedtime.     metoprolol succinate 50 MG 24 hr tablet  Commonly known as:  TOPROL-XL  Take 50 mg by mouth daily.     multivitamin with minerals tablet  Take 1 tablet by mouth daily.     omeprazole 20 MG capsule  Commonly known as:  PRILOSEC  Take 20 mg by mouth daily.     ondansetron 8 MG disintegrating tablet  Commonly known as:  ZOFRAN ODT  Take 1 tablet (8 mg total) by mouth every 8 (eight) hours as needed for nausea or vomiting.     PROCTOZONE-HC 2.5 % rectal cream  Generic drug:  hydrocortisone  Place 1 application rectally daily as needed (Applies to leg.).     sertraline 50 MG tablet  Commonly known as:  ZOLOFT  Take 50 mg by mouth daily.           Follow-up Information    Follow up with Sandi Mariscal, MD.   Specialty:  Interventional Radiology   Why:  Radiology will call you with follow up appointment with Dr. Pascal Lux in the interventional radiology clinic in 3-4 weeks.; Please call [phone] or [phone] with any questions or concerns.   Contact information:   [address] [place] [address] [phone]       Follow up with Nickie Retort, MD.   Specialty:  Urology    Why:  Follow-up with Dr. Pilar Jarvis as scheduled   Contact information:   [address] [place] [address] [phone]        Electronically Signed: D. Rowe Robert 01/25/2016, 8:23 AM   I have spent less than 30 minutes discharging Stephen Fields.

## 2016-01-25 NOTE — Progress Notes (Signed)
Received report on pt.Stephen Fields

## 2016-01-28 ENCOUNTER — Other Ambulatory Visit: Payer: Self-pay | Admitting: *Deleted

## 2016-01-28 DIAGNOSIS — N2889 Other specified disorders of kidney and ureter: Secondary | ICD-10-CM

## 2016-02-25 ENCOUNTER — Ambulatory Visit
Admission: RE | Admit: 2016-02-25 | Discharge: 2016-02-25 | Disposition: A | Discharge status: Home / Self Care | Payer: Medicare Other | Source: Ambulatory Visit | Attending: Radiology | Admitting: Radiology

## 2016-02-25 DIAGNOSIS — N2889 Other specified disorders of kidney and ureter: Secondary | ICD-10-CM
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
## 2016-03-02 ENCOUNTER — Ambulatory Visit (INDEPENDENT_AMBULATORY_CARE_PROVIDER_SITE_OTHER): Payer: Medicare Other | Admitting: Urology

## 2016-03-02 VITALS — BP 145/77 | HR 82 | Ht 68.0 in | Wt 179.0 lb

## 2016-03-02 DIAGNOSIS — N2889 Other specified disorders of kidney and ureter: Secondary | ICD-10-CM | POA: Diagnosis not present

## 2016-03-02 DIAGNOSIS — Z8546 Personal history of malignant neoplasm of prostate: Secondary | ICD-10-CM | POA: Diagnosis not present

## 2016-03-02 NOTE — Progress Notes (Signed)
03/02/2016 11:07 AM   Stephen Fields December 16, 1931 [id number]  Referring provider: Rusty Aus, MD [facility] [place], [place] [address]  Chief Complaint  Patient presents with  . Routine Post Op    path results    HPI: The patient is an 82 year old gentleman with a past medical history significant for prostatectomy and penile prosthesis presents with a 2.1 cm lesion on the left lateral kidney that is concerning for renal cell carcinoma.  He underwent cryoablation in May 2017 of this mass. Pathology showed an oncocytic neoplasm. Though this is benign, oncocytoma's commonly harbor renal cell carcinoma in small quantities which can be missed by biopsy. He has follow-up scheduled for CT surveillance every 3 months for the first year and every 6 months the following year with the interventional radiology appointment.  He does have a history of prostate cancer Status post prostatectomy in 1995. His PSA was 0.01 in May 2017.  He also underwent IPP placement.   PMH: Past Medical History  Diagnosis Date  . Anxiety   . Hypertension   . GERD (gastroesophageal reflux disease)   . Depression   . HLD (hyperlipidemia)   . Heart attack ([place]) 2003 OR 2004    WITH LEFT HIP REPLACMENT SURGERY  . History of kidney stones     X 110YRS AGO  . Hemorrhoids     HX OF  . Lumbar stenosis   . Prostate cancer ([place]) Sep 01, 1993  . Lumbar stress fracture 2010    L2 TO L3   . Pulmonary embolus ([place]) 2003 OR 2004    AFTER HIP REPLACEMENT  . Abnormal EKG     HX OF RIGHT BUNDLE BRANCH BLOCK  . Complication of anesthesia 2003 OR 2004    MI AFTER HIP REPLACMENT SURGERY  . Rotator cuff tear SEVERAL YRS AGO, STILL HAS    RIGHT  . Renal lesion     LEFT    Surgical History: Past Surgical History  Procedure Laterality Date  . Prostatectomy  JAN 1995  . Amputation      left leg ABOVE KNEE  . Hemorrhoid surgery  YRS AGO  . Testicle hernia repair  40 OR 79  YRS AGO  . Lumbar injections to back  3 YRS AGO  . Joint replacement Left 2003 OR 2004    Hip  . Right knee replacement  04-20-2005  . Cholecystectomy  YRS AGO    Home Medications:    Medication List       This list is accurate as of: 03/02/16 11:59 PM.  Always use your most recent med list.               amLODipine 5 MG tablet  Commonly known as:  NORVASC  Take 5 mg by mouth daily.     lisinopril-hydrochlorothiazide 20-12.5 MG tablet  Commonly known as:  PRINZIDE,ZESTORETIC  Take 1 tablet by mouth daily.     MELATONIN MAXIMUM STRENGTH 5 MG Tabs  Generic drug:  Melatonin  Take 10 mg by mouth at bedtime.     metoprolol succinate 50 MG 24 hr tablet  Commonly known as:  TOPROL-XL  Take 50 mg by mouth daily.     multivitamin with minerals tablet  Take 1 tablet by mouth daily.     omeprazole 20 MG capsule  Commonly known as:  PRILOSEC  Take 20 mg by mouth daily.     ondansetron 8 MG disintegrating tablet  Commonly known  as:  ZOFRAN ODT  Take 1 tablet (8 mg total) by mouth every 8 (eight) hours as needed for nausea or vomiting.     PROCTOZONE-HC 2.5 % rectal cream  Generic drug:  hydrocortisone  Place 1 application rectally daily as needed (Applies to leg.).     sertraline 50 MG tablet  Commonly known as:  ZOLOFT  Take 50 mg by mouth daily.        Allergies: No Known Allergies  Family History: Family History  Problem Relation Age of Onset  . Kidney disease Neg Hx   . Prostate cancer Father   . Bladder Cancer      Social History:  reports that he has never smoked. He has never used smokeless tobacco. He reports that he does not drink alcohol or use illicit drugs.  ROS:                                        Physical Exam: BP 145/77 mmHg  Pulse 82  Ht 5\' 8"  (1.727 m)  Wt 179 lb (81.194 kg)  BMI 27.22 kg/m2  Constitutional:  Alert and oriented, No acute distress. HEENT: [place] AT, moist mucus membranes.  Trachea midline, no  masses. Cardiovascular: No clubbing, cyanosis, or edema. Respiratory: Normal respiratory effort, no increased work of breathing. GI: Abdomen is soft, nontender, nondistended, no abdominal masses GU: No CVA tenderness.  Skin: No rashes, bruises or suspicious lesions. Lymph: No cervical or inguinal adenopathy. Neurologic: Grossly intact, no focal deficits, moving all 4 extremities. Psychiatric: Normal mood and affect.  Laboratory Data: Lab Results  Component Value Date   WBC 13.9* 01/25/2016   HGB 12.0* 01/25/2016   HCT 35.2* 01/25/2016   MCV 91.2 01/25/2016   PLT 375 01/25/2016    Lab Results  Component Value Date   CREATININE 0.78 01/25/2016    No results found for: PSA  No results found for: TESTOSTERONE  No results found for: HGBA1C  Urinalysis    Component Value Date/Time   APPEARANCEUR Clear 01/03/2016 1545   GLUCOSEU Negative 01/03/2016 1545   BILIRUBINUR Negative 01/03/2016 1545   PROTEINUR Negative 01/03/2016 1545   NITRITE Negative 01/03/2016 1545   LEUKOCYTESUR Negative 01/03/2016 1545      Assessment & Plan:    1. 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  2. History of prostate cancer Due for PSA in May 2018  3. Erectile Dysfunction -continue IPP  Return in about 1 year (around 03/02/2017).  Nickie Retort, MD  [facility] [address] [place], [place] [address] [phone]

## 2016-03-25 ENCOUNTER — Encounter: Payer: Self-pay | Admitting: Radiology

## 2016-03-25 ENCOUNTER — Other Ambulatory Visit (HOSPITAL_COMMUNITY): Payer: Self-pay | Admitting: Interventional Radiology

## 2016-03-25 ENCOUNTER — Other Ambulatory Visit: Payer: Self-pay | Admitting: Radiology

## 2016-03-25 DIAGNOSIS — N2889 Other specified disorders of kidney and ureter: Secondary | ICD-10-CM

## 2016-04-30 ENCOUNTER — Ambulatory Visit
Admission: RE | Admit: 2016-04-30 | Discharge: 2016-04-30 | Disposition: A | Discharge status: Home / Self Care | Payer: Medicare Other | Source: Ambulatory Visit | Attending: Interventional Radiology | Admitting: Interventional Radiology

## 2016-04-30 DIAGNOSIS — N200 Calculus of kidney: Secondary | ICD-10-CM | POA: Diagnosis not present

## 2016-04-30 DIAGNOSIS — K449 Diaphragmatic hernia without obstruction or gangrene: Secondary | ICD-10-CM | POA: Insufficient documentation

## 2016-04-30 DIAGNOSIS — I700 Atherosclerosis of aorta: Secondary | ICD-10-CM | POA: Insufficient documentation

## 2016-04-30 DIAGNOSIS — K760 Fatty (change of) liver, not elsewhere classified: Secondary | ICD-10-CM | POA: Insufficient documentation

## 2016-04-30 DIAGNOSIS — K7689 Other specified diseases of liver: Secondary | ICD-10-CM | POA: Diagnosis not present

## 2016-04-30 DIAGNOSIS — N2889 Other specified disorders of kidney and ureter: Secondary | ICD-10-CM | POA: Diagnosis not present

## 2016-04-30 MED ORDER — IOPAMIDOL (ISOVUE-370) INJECTION 76%
150.0000 mL | Freq: Once | INTRAVENOUS | Status: AC | PRN
  Administered 2016-04-30: 100 mL via INTRAVENOUS

## 2016-05-07 ENCOUNTER — Other Ambulatory Visit: Payer: Medicare Other

## 2016-05-17 ENCOUNTER — Ambulatory Visit: Payer: Medicare Other

## 2016-05-17 ENCOUNTER — Ambulatory Visit
Admission: EM | Admit: 2016-05-17 | Discharge: 2016-05-17 | Disposition: A | Discharge status: Home / Self Care | Payer: Medicare Other | Attending: Family Medicine | Admitting: Family Medicine

## 2016-05-17 DIAGNOSIS — Z8546 Personal history of malignant neoplasm of prostate: Secondary | ICD-10-CM | POA: Insufficient documentation

## 2016-05-17 DIAGNOSIS — M4806 Spinal stenosis, lumbar region: Secondary | ICD-10-CM | POA: Insufficient documentation

## 2016-05-17 DIAGNOSIS — S92301A Fracture of unspecified metatarsal bone(s), right foot, initial encounter for closed fracture: Secondary | ICD-10-CM | POA: Diagnosis not present

## 2016-05-17 DIAGNOSIS — N2889 Other specified disorders of kidney and ureter: Secondary | ICD-10-CM | POA: Insufficient documentation

## 2016-05-17 DIAGNOSIS — F329 Major depressive disorder, single episode, unspecified: Secondary | ICD-10-CM | POA: Insufficient documentation

## 2016-05-17 DIAGNOSIS — Z86711 Personal history of pulmonary embolism: Secondary | ICD-10-CM | POA: Diagnosis not present

## 2016-05-17 DIAGNOSIS — Z87442 Personal history of urinary calculi: Secondary | ICD-10-CM | POA: Diagnosis not present

## 2016-05-17 DIAGNOSIS — S62304A Unspecified fracture of fourth metacarpal bone, right hand, initial encounter for closed fracture: Secondary | ICD-10-CM | POA: Insufficient documentation

## 2016-05-17 DIAGNOSIS — M7989 Other specified soft tissue disorders: Secondary | ICD-10-CM | POA: Diagnosis present

## 2016-05-17 DIAGNOSIS — I252 Old myocardial infarction: Secondary | ICD-10-CM | POA: Insufficient documentation

## 2016-05-17 DIAGNOSIS — F419 Anxiety disorder, unspecified: Secondary | ICD-10-CM | POA: Diagnosis not present

## 2016-05-17 DIAGNOSIS — Z96642 Presence of left artificial hip joint: Secondary | ICD-10-CM | POA: Diagnosis not present

## 2016-05-17 DIAGNOSIS — E785 Hyperlipidemia, unspecified: Secondary | ICD-10-CM | POA: Insufficient documentation

## 2016-05-17 DIAGNOSIS — I4510 Unspecified right bundle-branch block: Secondary | ICD-10-CM | POA: Diagnosis not present

## 2016-05-17 DIAGNOSIS — W010XXA Fall on same level from slipping, tripping and stumbling without subsequent striking against object, initial encounter: Secondary | ICD-10-CM | POA: Insufficient documentation

## 2016-05-17 DIAGNOSIS — Z9889 Other specified postprocedural states: Secondary | ICD-10-CM | POA: Insufficient documentation

## 2016-05-17 DIAGNOSIS — Z96651 Presence of right artificial knee joint: Secondary | ICD-10-CM | POA: Insufficient documentation

## 2016-05-17 DIAGNOSIS — S62330A Displaced fracture of neck of second metacarpal bone, right hand, initial encounter for closed fracture: Secondary | ICD-10-CM | POA: Insufficient documentation

## 2016-05-17 DIAGNOSIS — S62306A Unspecified fracture of fifth metacarpal bone, right hand, initial encounter for closed fracture: Secondary | ICD-10-CM | POA: Diagnosis not present

## 2016-05-17 DIAGNOSIS — I10 Essential (primary) hypertension: Secondary | ICD-10-CM | POA: Diagnosis not present

## 2016-05-17 DIAGNOSIS — K219 Gastro-esophageal reflux disease without esophagitis: Secondary | ICD-10-CM | POA: Diagnosis not present

## 2016-05-17 DIAGNOSIS — S62302A Unspecified fracture of third metacarpal bone, right hand, initial encounter for closed fracture: Secondary | ICD-10-CM | POA: Insufficient documentation

## 2016-05-17 DIAGNOSIS — Y92481 Parking lot as the place of occurrence of the external cause: Secondary | ICD-10-CM | POA: Insufficient documentation

## 2016-05-17 NOTE — Discharge Instructions (Signed)
Please contact the Orthopedist (or another one if you choose).  PRN Tylenol for pain.  Take care  Dr. Lacinda Axon

## 2016-05-17 NOTE — ED Provider Notes (Signed)
[facility]    CSN: [id number] Arrival date & time: 05/17/16  1449  First Provider Contact:  First MD Initiated Contact with Patient 05/17/16 1516     History   Chief Complaint Chief Complaint  Patient presents with  . Hand Injury    Right    HPI 82 year old male with a complicated past medical history including cardiac disease presents with complaints of right hand pain/swelling after falling on Friday.  Patient states that he was out to dinner on Friday and stumbled over a speed bump in the parking lot. He states that his walker slipped out of the way and he fell forward onto an outstretched right hand. He subsequently developed pain, swelling, and bruising. Patient thought that this would improve and thus did not come in immediately for evaluation. He reports that his pain is mild to moderate and particularly prominent at the first MCP joint. No medications or interventions tried. Worse with palpation/touch. No known relieving factors. No other associated symptoms. No other complaints at this time.   Past Medical History:  Diagnosis Date  . Abnormal EKG    HX OF RIGHT BUNDLE BRANCH BLOCK  . Anxiety   . Complication of anesthesia 2003 OR 2004   MI AFTER HIP REPLACMENT SURGERY  . Depression   . GERD (gastroesophageal reflux disease)   . Heart attack ([place]) 2003 OR 2004   WITH LEFT HIP REPLACMENT SURGERY  . Hemorrhoids    HX OF  . History of kidney stones    X 8YRS AGO  . HLD (hyperlipidemia)   . Hypertension   . Lumbar stenosis   . Lumbar stress fracture 2010   L2 TO L3   . Prostate cancer ([place]) Sep 01, 1993  . Pulmonary embolus ([place]) 2003 OR 2004   AFTER HIP REPLACEMENT  . Renal lesion    LEFT  . Rotator cuff tear SEVERAL YRS AGO, STILL HAS   RIGHT    Patient Active Problem List   Diagnosis Date Noted  . Renal mass, left 01/24/2016  . Encounter for preprocedural cardiovascular examination 01/20/2016  . 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 H/O malignant neoplasm of prostate 09/25/2014  . Lumbar canal stenosis 04/05/2014  . Acid reflux 12/27/2013  . BP (high blood pressure) 12/27/2013  . Compression fracture of vertebral column ([place]) 12/27/2013    Past Surgical History:  Procedure Laterality Date  . AMPUTATION     left leg ABOVE KNEE  . CHOLECYSTECTOMY  YRS AGO  . HEMORRHOID SURGERY  YRS AGO  . JOINT REPLACEMENT Left 2003 OR 2004   Hip  . LUMBAR INJECTIONS TO BACK  3 YRS AGO  . [place]  . RIGHT KNEE REPLACEMENT  04-20-2005  . TESTICLE HERNIA REPAIR  40 OR [place] Medications    Prior to Admission medications   Medication Sig Start Date End Date Taking? Authorizing Provider  amLODipine (NORVASC) 5 MG tablet Take 5 mg by mouth daily.  01/14/15  Yes Historical Provider, MD  lisinopril-hydrochlorothiazide (PRINZIDE,ZESTORETIC) 20-12.5 MG tablet Take 1 tablet by mouth daily. 12/30/15  Yes Historical Provider, MD  Melatonin (MELATONIN MAXIMUM STRENGTH) 5 MG TABS Take 10 mg by mouth at bedtime.    Yes Historical Provider, MD  metoprolol succinate (TOPROL-XL) 50 MG 24 hr tablet Take 50 mg by mouth daily.  01/14/15  Yes Historical Provider, MD  Multiple Vitamins-Minerals (MULTIVITAMIN WITH MINERALS) tablet Take 1 tablet by  mouth daily.    Yes Historical Provider, MD  omeprazole (PRILOSEC) 20 MG capsule Take 20 mg by mouth daily.   Yes Historical Provider, MD  sertraline (ZOLOFT) 50 MG tablet Take 50 mg by mouth daily.   Yes Historical Provider, MD  hydrocortisone (PROCTOZONE-HC) 2.5 % rectal cream Place 1 application rectally daily as needed (Applies to leg.).  07/24/15   Historical Provider, MD  ondansetron (ZOFRAN ODT) 8 MG disintegrating tablet Take 1 tablet (8 mg total) by mouth every 8 (eight) hours as needed for nausea or vomiting. Patient not taking: Reported on 05/17/2016 12/25/15   Norval Gable, MD   Family History Family History  Problem Relation Age of Onset  .  Prostate cancer Father   . Bladder Cancer    . Kidney disease Neg Hx    Social History Social History  Substance Use Topics  . Smoking status: Never Smoker  . Smokeless tobacco: Never Used  . Alcohol use No   Allergies   Review of patient's allergies indicates no known allergies.  Review of Systems Review of Systems  Constitutional: Negative.   Musculoskeletal:       Right hand, swelling/pain.   Physical Exam Triage Vital Signs ED Triage Vitals  Enc Vitals Group     BP 05/17/16 1505 (!) 144/67     Pulse Rate 05/17/16 1505 91     Resp 05/17/16 1505 18     Temp 05/17/16 1505 98 F (36.7 C)     Temp Source 05/17/16 1505 Oral     SpO2 05/17/16 1505 97 %     Weight 05/17/16 1503 185 lb (83.9 kg)     Height 05/17/16 1503 5\' 9"  (1.753 m)     Head Circumference --      Peak Flow --      Pain Score 05/17/16 1504 1     Pain Loc --      Pain Edu? --      Excl. in [place]? --    Updated Vital Signs BP (!) 144/67 (BP Location: Left Arm)   Pulse 91   Temp 98 F (36.7 C) (Oral)   Resp 18   Ht 5\' 9"  (1.753 m)   Wt 185 lb (83.9 kg)   SpO2 97%   BMI 27.32 kg/m    Physical Exam  Constitutional: He is oriented to person, place, and time. He appears well-developed. No distress.  Cardiovascular: Normal rate and regular rhythm.   Pulmonary/Chest: Effort normal and breath sounds normal.  Musculoskeletal:  Right hand - marked swelling in the right hand. Exquisitely tender to palpation at the 2nd MCP joint. Bruising of the thenar eminence noted. Neurovascularly intact.   Neurological: He is alert and oriented to person, place, and time.  Psychiatric: He has a normal mood and affect.  Vitals reviewed.  UC Treatments / Results  Labs (all labs ordered are listed, but only abnormal results are displayed) Labs Reviewed - No data to display  EKG  EKG Interpretation None      Radiology Dg Hand Complete Right  Result Date: 05/17/2016 CLINICAL DATA:  Fall with acute right hand  pain.  Initial encounter. EXAM: RIGHT HAND - COMPLETE 3+ VIEW COMPARISON:  05/11/2006 FINDINGS: A fracture of the second metacarpal neck is age indeterminate. Remote fractures of the third, fourth and fifth metacarpals are identified. There is no evidence of subluxation or dislocation. Mild degenerative changes at the radiocarpal joint noted. Soft tissue swelling is present. A remote metallic foreign body in  the soft tissues lateral to the index finger proximal phalanx again noted. IMPRESSION: Soft tissue swelling with age indeterminate fracture of the second metacarpal neck - correlate clinically. Remote fractures of the third, fourth and fifth metacarpals. Electronically Signed   By: Margarette Canada M.D.   On: 05/17/2016 15:53   Procedures Procedures (including critical care time)  Medications Ordered in UC Medications - No data to display  Initial Impression / Assessment and Plan / UC Course  I have reviewed the triage vital signs and the nursing notes.  Pertinent labs & imaging results that were available during my care of the patient were reviewed by me and considered in my medical decision making (see chart for details).  82 year old male presents following [place] injury. Fracture of 2nd MCP noted on film (correlates with exam). Splinting and sending to Ortho for eval.   Final Clinical Impressions(s) / UC Diagnoses   Final diagnoses:  Fracture of 2nd metatarsal, right, closed, initial encounter   New Prescriptions New Prescriptions   No medications on file     Coral Spikes, DO 05/17/16 1612

## 2016-05-17 NOTE — ED Triage Notes (Signed)
Patient presents with a swollen right hand. He states he tripped over a speed bump and fell back onto his hand on Friday.

## 2016-05-20 ENCOUNTER — Telehealth: Payer: Self-pay | Admitting: *Deleted

## 2016-05-20 NOTE — Telephone Encounter (Signed)
Called patient and talked with patient's wife. Patient's reported that the patient is still in pain, but the pain has not become worse. Patient does have an appointment with a orthopedist to assess his hand. Encouraged patient's wife to seek medical attention if patient's hand suddenly becomes more painful or numb.

## 2016-05-26 ENCOUNTER — Ambulatory Visit
Admission: RE | Admit: 2016-05-26 | Discharge: 2016-05-26 | Disposition: A | Discharge status: Home / Self Care | Payer: Medicare Other | Source: Ambulatory Visit | Attending: Interventional Radiology | Admitting: Interventional Radiology

## 2016-05-26 DIAGNOSIS — N2889 Other specified disorders of kidney and ureter: Secondary | ICD-10-CM

## 2016-05-26 HISTORY — PX: IR GENERIC HISTORICAL: IMG1180011
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
## 2016-06-02 ENCOUNTER — Encounter: Payer: Self-pay | Admitting: Interventional Radiology

## 2016-08-04 ENCOUNTER — Other Ambulatory Visit (HOSPITAL_COMMUNITY): Payer: Self-pay | Admitting: Interventional Radiology

## 2016-08-04 DIAGNOSIS — N2889 Other specified disorders of kidney and ureter: Secondary | ICD-10-CM

## 2016-08-05 ENCOUNTER — Other Ambulatory Visit: Payer: Self-pay | Admitting: Radiology

## 2016-08-05 DIAGNOSIS — N2889 Other specified disorders of kidney and ureter: Secondary | ICD-10-CM

## 2016-08-11 ENCOUNTER — Ambulatory Visit
Admission: RE | Admit: 2016-08-11 | Discharge: 2016-08-11 | Disposition: A | Discharge status: Home / Self Care | Payer: Medicare Other | Source: Ambulatory Visit | Attending: Interventional Radiology | Admitting: Interventional Radiology

## 2016-08-11 ENCOUNTER — Other Ambulatory Visit
Admission: RE | Admit: 2016-08-11 | Discharge: 2016-08-11 | Disposition: A | Discharge status: Home / Self Care | Payer: Medicare Other | Source: Ambulatory Visit | Attending: Interventional Radiology | Admitting: Interventional Radiology

## 2016-08-11 DIAGNOSIS — N2889 Other specified disorders of kidney and ureter: Secondary | ICD-10-CM

## 2016-08-11 DIAGNOSIS — I700 Atherosclerosis of aorta: Secondary | ICD-10-CM | POA: Diagnosis not present

## 2016-08-11 DIAGNOSIS — N200 Calculus of kidney: Secondary | ICD-10-CM | POA: Insufficient documentation

## 2016-08-11 DIAGNOSIS — I2510 Atherosclerotic heart disease of native coronary artery without angina pectoris: Secondary | ICD-10-CM | POA: Insufficient documentation

## 2016-08-11 LAB — CREATININE, SERUM: Creatinine, Ser: 0.67 mg/dL (ref 0.61–1.24)

## 2016-08-11 LAB — BUN: BUN: 10 mg/dL (ref 6–20)

## 2016-08-11 MED ORDER — IOPAMIDOL (ISOVUE-300) INJECTION 61%
100.0000 mL | Freq: Once | INTRAVENOUS | Status: AC | PRN
  Administered 2016-08-11: 100 mL via INTRAVENOUS

## 2016-08-12 ENCOUNTER — Ambulatory Visit
Admission: RE | Admit: 2016-08-12 | Discharge: 2016-08-12 | Disposition: A | Discharge status: Home / Self Care | Payer: Medicare Other | Source: Ambulatory Visit | Attending: Interventional Radiology | Admitting: Interventional Radiology

## 2016-08-12 DIAGNOSIS — N2889 Other specified disorders of kidney and ureter: Secondary | ICD-10-CM

## 2016-08-12 HISTORY — PX: IR GENERIC HISTORICAL: IMG1180011
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
## 2016-09-03 ENCOUNTER — Encounter: Payer: Self-pay | Admitting: Interventional Radiology

## 2016-09-04 ENCOUNTER — Encounter: Payer: Self-pay | Admitting: Interventional Radiology

## 2017-01-26 ENCOUNTER — Other Ambulatory Visit: Payer: Self-pay | Admitting: Internal Medicine

## 2017-01-26 DIAGNOSIS — G9349 Other encephalopathy: Secondary | ICD-10-CM

## 2017-01-28 ENCOUNTER — Other Ambulatory Visit (HOSPITAL_COMMUNITY): Payer: Self-pay | Admitting: Interventional Radiology

## 2017-01-28 DIAGNOSIS — N2889 Other specified disorders of kidney and ureter: Secondary | ICD-10-CM

## 2017-02-02 ENCOUNTER — Ambulatory Visit
Admission: RE | Admit: 2017-02-02 | Discharge: 2017-02-02 | Disposition: A | Discharge status: Home / Self Care | Payer: Medicare Other | Source: Ambulatory Visit | Attending: Internal Medicine | Admitting: Internal Medicine

## 2017-02-02 DIAGNOSIS — G319 Degenerative disease of nervous system, unspecified: Secondary | ICD-10-CM | POA: Insufficient documentation

## 2017-02-02 DIAGNOSIS — G9349 Other encephalopathy: Secondary | ICD-10-CM | POA: Insufficient documentation

## 2017-02-02 DIAGNOSIS — G9389 Other specified disorders of brain: Secondary | ICD-10-CM | POA: Diagnosis not present

## 2017-02-09 ENCOUNTER — Encounter: Payer: Self-pay | Admitting: Radiology

## 2017-02-12 ENCOUNTER — Encounter: Payer: Self-pay | Admitting: *Deleted

## 2017-02-12 ENCOUNTER — Ambulatory Visit: Payer: Medicare Other

## 2017-02-12 ENCOUNTER — Ambulatory Visit
Admission: EM | Admit: 2017-02-12 | Discharge: 2017-02-12 | Disposition: A | Discharge status: Home / Self Care | Payer: Medicare Other | Attending: Emergency Medicine | Admitting: Emergency Medicine

## 2017-02-12 DIAGNOSIS — E785 Hyperlipidemia, unspecified: Secondary | ICD-10-CM | POA: Insufficient documentation

## 2017-02-12 DIAGNOSIS — F329 Major depressive disorder, single episode, unspecified: Secondary | ICD-10-CM | POA: Diagnosis not present

## 2017-02-12 DIAGNOSIS — Z89612 Acquired absence of left leg above knee: Secondary | ICD-10-CM | POA: Diagnosis not present

## 2017-02-12 DIAGNOSIS — Z8546 Personal history of malignant neoplasm of prostate: Secondary | ICD-10-CM | POA: Insufficient documentation

## 2017-02-12 DIAGNOSIS — Z9181 History of falling: Secondary | ICD-10-CM | POA: Diagnosis not present

## 2017-02-12 DIAGNOSIS — K219 Gastro-esophageal reflux disease without esophagitis: Secondary | ICD-10-CM | POA: Diagnosis not present

## 2017-02-12 DIAGNOSIS — I10 Essential (primary) hypertension: Secondary | ICD-10-CM | POA: Diagnosis not present

## 2017-02-12 DIAGNOSIS — Z79899 Other long term (current) drug therapy: Secondary | ICD-10-CM | POA: Insufficient documentation

## 2017-02-12 DIAGNOSIS — F419 Anxiety disorder, unspecified: Secondary | ICD-10-CM | POA: Insufficient documentation

## 2017-02-12 DIAGNOSIS — M25512 Pain in left shoulder: Secondary | ICD-10-CM | POA: Insufficient documentation

## 2017-02-12 MED ORDER — MELOXICAM 7.5 MG PO TABS: 7.5000 mg | ORAL_TABLET | Freq: Every day | ORAL | 0 refills | Status: AC

## 2017-02-12 NOTE — ED Triage Notes (Signed)
Patient started having left upper arm pain last PM possibly due to a previous fall.
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
## 2017-02-12 NOTE — Discharge Instructions (Signed)
You need to try and start moving your shoulder. Otherwise it can freeze up and it will be very difficult to regain range of motion again. Try the meloxicam for 5 days. Take it with 500 mg of Tylenol. you may take an additional 500 mg of Tylenol another 2 times a day as needed for pain. Ice the shoulder for 20 minutes at a time. Go to the ER for color changes, pain not controlled medications, fevers above 100.4, or other concerns.

## 2017-02-18 ENCOUNTER — Emergency Department
Admission: EM | Admit: 2017-02-18 | Discharge: 2017-02-18 | Disposition: A | Discharge status: Home / Self Care | Payer: Medicare Other | Attending: Emergency Medicine | Admitting: Emergency Medicine

## 2017-02-18 DIAGNOSIS — Z9049 Acquired absence of other specified parts of digestive tract: Secondary | ICD-10-CM | POA: Insufficient documentation

## 2017-02-18 DIAGNOSIS — Z96651 Presence of right artificial knee joint: Secondary | ICD-10-CM | POA: Diagnosis not present

## 2017-02-18 DIAGNOSIS — Z8546 Personal history of malignant neoplasm of prostate: Secondary | ICD-10-CM | POA: Insufficient documentation

## 2017-02-18 DIAGNOSIS — Z79899 Other long term (current) drug therapy: Secondary | ICD-10-CM | POA: Diagnosis not present

## 2017-02-18 DIAGNOSIS — K5641 Fecal impaction: Secondary | ICD-10-CM | POA: Diagnosis not present

## 2017-02-18 DIAGNOSIS — Z7982 Long term (current) use of aspirin: Secondary | ICD-10-CM | POA: Diagnosis not present

## 2017-02-18 DIAGNOSIS — K5900 Constipation, unspecified: Secondary | ICD-10-CM | POA: Diagnosis present

## 2017-02-18 DIAGNOSIS — I10 Essential (primary) hypertension: Secondary | ICD-10-CM | POA: Diagnosis not present

## 2017-02-18 MED ORDER — LACTULOSE 10 GM/15ML PO SOLN
10.0000 g | Freq: Once | ORAL | Status: AC
  Administered 2017-02-18: 10 g via ORAL
  Filled 2017-02-18: qty 30

## 2017-02-18 MED ORDER — METOPROLOL SUCCINATE ER 50 MG PO TB24
50.0000 mg | ORAL_TABLET | Freq: Every day | ORAL | Status: DC
  Administered 2017-02-18: 50 mg via ORAL
  Filled 2017-02-18: qty 1

## 2017-02-18 MED ORDER — MAGNESIUM CITRATE PO SOLN
0.5000 | Freq: Once | ORAL | Status: DC
  Filled 2017-02-18: qty 296

## 2017-02-18 MED ORDER — DOCUSATE SODIUM 50 MG/5ML PO LIQD
100.0000 mg | Freq: Once | ORAL | Status: DC
  Filled 2017-02-18: qty 10

## 2017-02-18 MED ORDER — ACETAMINOPHEN 325 MG PO TABS
650.0000 mg | ORAL_TABLET | Freq: Once | ORAL | Status: AC
  Administered 2017-02-18: 650 mg via ORAL
  Filled 2017-02-18: qty 2

## 2017-02-18 NOTE — ED Triage Notes (Signed)
Pt reports to the ED via [facility] for eval of constipation x 4 days and left hip pain. Pt wears a prothesis to the left leg and states that does not fit well so he is getting a new one made. Denies any fall or trauma to the hip. Pain in his hip is worse with movement. He also reports constipation x 4 days with some associated abd pain. He states that he will strain but nothing will come out. Pt tachycardic in the 110s.

## 2017-02-18 NOTE — ED Provider Notes (Signed)
[facility] Emergency Department Provider Note  ____________________________________________   First MD Initiated Contact with Patient 02/18/17 1321     (approximate)  I have reviewed the triage vital signs and the nursing notes.   HISTORY  Chief Complaint Hip Pain and Constipation   HPI Stephen Fields is a 83 y.o. male patient is presenting with 4 days of constipation. Says that his last bowel movement was 4 days ago and only small pellets stools. Tried to push hard today to move his bowels and was unsuccessful. Unable to pass gas this time. Denies any nausea or vomiting but says that he has had a decreased appetite and is not eating and drinking at his normal level.   Past Medical History:  Diagnosis Date  . Abnormal EKG    HX OF RIGHT BUNDLE BRANCH BLOCK  . Anxiety   . Complication of anesthesia 2003 OR 2004   MI AFTER HIP REPLACMENT SURGERY  . Depression   . GERD (gastroesophageal reflux disease)   . Heart attack ([place]) 2003 OR 2004   WITH LEFT HIP REPLACMENT SURGERY  . Hemorrhoids    HX OF  . History of kidney stones    X 51YRS AGO  . HLD (hyperlipidemia)   . Hypertension   . Lumbar stenosis   . Lumbar stress fracture 2010   L2 TO L3   . Prostate cancer ([place]) Sep 01, 1993  . Pulmonary embolus ([place]) 2003 OR 2004   AFTER HIP REPLACEMENT  . Renal lesion    LEFT  . Rotator cuff tear SEVERAL YRS AGO, STILL HAS   RIGHT    Patient Active Problem List   Diagnosis Date Noted  . Renal mass, left 01/24/2016  . Encounter for preprocedural cardiovascular examination 01/20/2016  . 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 H/O malignant neoplasm of prostate 09/25/2014  . Lumbar canal stenosis 04/05/2014  . Acid reflux 12/27/2013  . BP (high blood pressure) 12/27/2013  . Compression fracture of vertebral column ([place]) 12/27/2013    Past Surgical History:  Procedure Laterality Date  . AMPUTATION     left leg ABOVE  KNEE  . CHOLECYSTECTOMY  YRS AGO  . HEMORRHOID SURGERY  YRS AGO  . IR GENERIC HISTORICAL  01/08/2016   IR RADIOLOGIST EVAL & MGMT 01/08/2016 Sandi Mariscal, MD [facility] INTERV RAD  . IR GENERIC HISTORICAL  05/26/2016   IR RADIOLOGIST EVAL & MGMT 05/26/2016 Sandi Mariscal, MD [facility] INTERV RAD  . IR GENERIC HISTORICAL  08/12/2016   IR RADIOLOGIST EVAL & MGMT 08/12/2016 Sandi Mariscal, MD [facility] INTERV RAD  . JOINT REPLACEMENT Left 2003 OR 2004   Hip  . LUMBAR INJECTIONS TO BACK  3 YRS AGO  . [place]  . RIGHT KNEE REPLACEMENT  04-20-2005  . TESTICLE HERNIA REPAIR  40 OR 50 YRS AGO    Prior to Admission medications   Medication Sig Start Date End Date Taking? Authorizing Provider  amLODipine (NORVASC) 5 MG tablet Take 5 mg by mouth daily.  01/14/15   [provider]  donepezil (ARICEPT) 5 MG tablet Take 5 mg by mouth at bedtime.    [provider]  hydrocortisone (PROCTOZONE-HC) 2.5 % rectal cream Place 1 application rectally daily as needed (Applies to leg.).  07/24/15   [provider]  lisinopril-hydrochlorothiazide (PRINZIDE,ZESTORETIC) 20-12.5 MG tablet Take 1 tablet by mouth daily. 12/30/15   [provider]  Melatonin (MELATONIN MAXIMUM STRENGTH) 5 MG TABS  Take 10 mg by mouth at bedtime.     [provider]  metoprolol succinate (TOPROL-XL) 50 MG 24 hr tablet Take 50 mg by mouth daily.  01/14/15   [provider]  Multiple Vitamins-Minerals (MULTIVITAMIN WITH MINERALS) tablet Take 1 tablet by mouth daily.     [provider]  omeprazole (PRILOSEC) 20 MG capsule Take 20 mg by mouth daily.    [provider]  ondansetron (ZOFRAN ODT) 8 MG disintegrating tablet Take 1 tablet (8 mg total) by mouth every 8 (eight) hours as needed for nausea or vomiting. 12/25/15   Norval Gable, MD  sertraline (ZOLOFT) 50 MG tablet Take 50 mg by mouth daily.    [provider]    Allergies Patient has no known  allergies.  Family History  Problem Relation Age of Onset  . Prostate cancer Father   . Bladder Cancer Unknown   . Kidney disease Neg Hx     Social History Social History  Substance Use Topics  . Smoking status: Never Smoker  . Smokeless tobacco: Never Used  . Alcohol use No    Review of Systems  Constitutional: No fever/chills Eyes: No visual changes. ENT: No sore throat. Cardiovascular: Denies chest pain. Respiratory: Denies shortness of breath. Gastrointestinal: no nausea, no vomiting.  No diarrhea.   Genitourinary: Negative for dysuria. Musculoskeletal: Negative for back pain. Skin: Negative for rash. Neurological: Negative for headaches, focal weakness or numbness.   ____________________________________________   PHYSICAL EXAM:  VITAL SIGNS: ED Triage Vitals [02/18/17 1315]  Enc Vitals Group     BP (!) 165/98     Pulse Rate (!) 115     Resp 16     Temp 98.5 F (36.9 C)     Temp Source Oral     SpO2 91 %     Weight      Height      Head Circumference      Peak Flow      Pain Score 8     Pain Loc      Pain Edu?      Excl. in [place]?     Constitutional: Alert and oriented. Well appearing and in no acute distress. Eyes: Conjunctivae are normal.  Head: Atraumatic. Nose: No congestion/rhinnorhea. Mouth/Throat: Mucous membranes are moist.  Neck: No stridor.   Cardiovascular: tachycardic, regular rhythm. Grossly normal heart sounds.   Respiratory: Normal respiratory effort.  No retractions. Lungs CTAB. Gastrointestinal: Soft and nontender. No distention. Digital rectal exam with a moderate amount of grossly brown impacted stool which was easily removed. No gross blood. Musculoskeletal: No lower extremity tenderness nor edema.  No joint effusions. Left lower extremity prosthesis above the knee. Neurologic:  Normal speech and language. No gross focal neurologic deficits are appreciated. Skin:  Skin is warm, dry and intact. No rash noted. Psychiatric: Mood  and affect are normal. Speech and behavior are normal.  ____________________________________________   LABS (all labs ordered are listed, but only abnormal results are displayed)  Labs Reviewed - No data to display ____________________________________________  EKG   ____________________________________________  RADIOLOGY   ____________________________________________   PROCEDURES  Procedure(s) performed:  ------------------------------------------------------------------------------------------------------------------- Fecal Disimpaction Procedure Note:  Performed by me:  Patient placed in the lateral recumbent position with knees drawn towards chest. Nurse present for patient support. Large amount of hard brown stool removed. No complications during procedure.   ------------------------------------------------------------------------------------------------------------------    Procedures  Critical Care performed:   ____________________________________________   INITIAL IMPRESSION / ASSESSMENT AND  PLAN / ED COURSE  Pertinent labs & imaging results that were available during my care of the patient were reviewed by me and considered in my medical decision making (see chart for details).    Clinical Course as of Feb 18 1550  Thu Feb 18, 2017  1451 Patient had a large bowel movement and says that he feels "so much better." Abdomen palpated and is nontender. However, the patient is still tachycardic. Says that he has not taken his metoprolol in several days. We will give him a dose of his metoprolol and reassess.  [DS]    Clinical Course User Index [DS] Orbie Pyo, MD   ----------------------------------------- 3:51 PM on 02/18/2017 -----------------------------------------  Patient, after taking his daily dose of metoprolol as a heart rate of 96 bpm. Is complaining of a frontal headache but says he hasn't eaten all day and this is typical when  he does not eat that he gets a headache. Is requesting Tylenol and is also eating a sandwich at this time. He'll be discharged home. We discussed making sure that he is drinking clinical fluids, especially water. Patient says that he also has a stool softener to use as needed. We also discussed taking plenty of fruits and vegetables. He'll be discharged home to follow up with his primary care doctor. No further abdominal pain.  ____________________________________________   FINAL CLINICAL IMPRESSION(S) / ED DIAGNOSES  Fecal impaction.    NEW MEDICATIONS STARTED DURING THIS VISIT:  New Prescriptions   No medications on file     Note:  This document was prepared using Dragon voice recognition software and may include unintentional dictation errors.     Orbie Pyo, MD 02/18/17 [id number]

## 2017-02-22 ENCOUNTER — Ambulatory Visit
Admission: RE | Admit: 2017-02-22 | Discharge: 2017-02-22 | Disposition: A | Discharge status: Home / Self Care | Payer: Medicare Other | Source: Ambulatory Visit | Attending: Interventional Radiology | Admitting: Interventional Radiology

## 2017-02-22 DIAGNOSIS — N200 Calculus of kidney: Secondary | ICD-10-CM | POA: Insufficient documentation

## 2017-02-22 DIAGNOSIS — N2889 Other specified disorders of kidney and ureter: Secondary | ICD-10-CM | POA: Insufficient documentation

## 2017-02-22 DIAGNOSIS — I700 Atherosclerosis of aorta: Secondary | ICD-10-CM | POA: Insufficient documentation

## 2017-02-22 MED ORDER — IOPAMIDOL (ISOVUE-300) INJECTION 61%
100.0000 mL | Freq: Once | INTRAVENOUS | Status: AC | PRN
  Administered 2017-02-22: 100 mL via INTRAVENOUS

## 2017-02-23 ENCOUNTER — Other Ambulatory Visit: Payer: Self-pay | Admitting: Orthopedic Surgery

## 2017-02-23 DIAGNOSIS — G8929 Other chronic pain: Secondary | ICD-10-CM

## 2017-02-23 DIAGNOSIS — M25512 Pain in left shoulder: Principal | ICD-10-CM

## 2017-03-02 ENCOUNTER — Ambulatory Visit: Payer: Medicare Other

## 2017-03-09 ENCOUNTER — Ambulatory Visit
Admission: RE | Admit: 2017-03-09 | Discharge: 2017-03-09 | Disposition: A | Discharge status: Home / Self Care | Payer: Medicare Other | Source: Ambulatory Visit | Attending: Orthopedic Surgery | Admitting: Orthopedic Surgery

## 2017-03-09 ENCOUNTER — Ambulatory Visit
Admission: RE | Admit: 2017-03-09 | Discharge: 2017-03-09 | Disposition: A | Discharge status: Home / Self Care | Payer: Medicare Other | Source: Ambulatory Visit | Attending: Interventional Radiology | Admitting: Interventional Radiology

## 2017-03-09 DIAGNOSIS — M25512 Pain in left shoulder: Secondary | ICD-10-CM | POA: Diagnosis present

## 2017-03-09 DIAGNOSIS — N2889 Other specified disorders of kidney and ureter: Secondary | ICD-10-CM

## 2017-03-09 DIAGNOSIS — M75122 Complete rotator cuff tear or rupture of left shoulder, not specified as traumatic: Secondary | ICD-10-CM | POA: Diagnosis not present

## 2017-03-09 DIAGNOSIS — G8929 Other chronic pain: Secondary | ICD-10-CM

## 2017-03-09 DIAGNOSIS — M19012 Primary osteoarthritis, left shoulder: Secondary | ICD-10-CM | POA: Insufficient documentation

## 2017-03-09 HISTORY — PX: IR RADIOLOGIST EVAL & MGMT: IMG5224
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
## 2017-03-30 ENCOUNTER — Other Ambulatory Visit: Payer: Self-pay | Admitting: Orthopedic Surgery

## 2017-04-08 ENCOUNTER — Encounter
Admission: RE | Admit: 2017-04-08 | Discharge: 2017-04-08 | Disposition: A | Discharge status: Home / Self Care | Payer: Medicare Other | Source: Ambulatory Visit | Attending: Orthopedic Surgery | Admitting: Orthopedic Surgery

## 2017-04-08 DIAGNOSIS — I4510 Unspecified right bundle-branch block: Secondary | ICD-10-CM | POA: Diagnosis not present

## 2017-04-08 DIAGNOSIS — Z01810 Encounter for preprocedural cardiovascular examination: Secondary | ICD-10-CM | POA: Insufficient documentation

## 2017-04-08 DIAGNOSIS — Z01812 Encounter for preprocedural laboratory examination: Secondary | ICD-10-CM | POA: Insufficient documentation

## 2017-04-08 HISTORY — DX: Acquired absence of left leg above knee: Z89.612

## 2017-04-08 HISTORY — DX: Unspecified dementia, unspecified severity, without behavioral disturbance, psychotic disturbance, mood disturbance, and anxiety: F03.90

## 2017-04-08 LAB — CBC WITH DIFFERENTIAL/PLATELET
BASOS ABS: 0.1 10*3/uL (ref 0–0.1)
BASOS PCT: 1 %
EOS PCT: 1 %
Eosinophils Absolute: 0.1 10*3/uL (ref 0–0.7)
HCT: 41.5 % (ref 40.0–52.0)
Hemoglobin: 13.6 g/dL (ref 13.0–18.0)
LYMPHS PCT: 20 %
Lymphs Abs: 1.9 10*3/uL (ref 1.0–3.6)
MCH: 29.4 pg (ref 26.0–34.0)
MCHC: 32.7 g/dL (ref 32.0–36.0)
MCV: 90 fL (ref 80.0–100.0)
MONO ABS: 1.3 10*3/uL — AB (ref 0.2–1.0)
Monocytes Relative: 14 %
Neutro Abs: 6.1 10*3/uL (ref 1.4–6.5)
Neutrophils Relative %: 64 %
PLATELETS: 567 10*3/uL — AB (ref 150–440)
RBC: 4.62 MIL/uL (ref 4.40–5.90)
RDW: 13.6 % (ref 11.5–14.5)
WBC: 9.5 10*3/uL (ref 3.8–10.6)

## 2017-04-08 LAB — BASIC METABOLIC PANEL
ANION GAP: 10 (ref 5–15)
BUN: 12 mg/dL (ref 6–20)
CALCIUM: 9.2 mg/dL (ref 8.9–10.3)
CO2: 31 mmol/L (ref 22–32)
Chloride: 95 mmol/L — ABNORMAL LOW (ref 101–111)
Creatinine, Ser: 0.75 mg/dL (ref 0.61–1.24)
GFR calc Af Amer: >60 mL/min (ref >60)
Glucose, Bld: 68 mg/dL (ref 65–99)
POTASSIUM: 3.6 mmol/L (ref 3.5–5.1)
SODIUM: 136 mmol/L (ref 135–145)

## 2017-04-08 LAB — PROTIME-INR
INR: 0.99
PROTHROMBIN TIME: 13.1 s (ref 11.4–15.2)

## 2017-04-08 LAB — APTT: APTT: 39 s — AB (ref 24–36)

## 2017-04-08 NOTE — Patient Instructions (Addendum)
  Your procedure is scheduled on: April 15, 2017 [facility] ) Report to Same Day Surgery 2nd floor medical mall ([place] Entrance-take elevator on left to 2nd floor.  Check in with surgery information desk.) To find out your arrival time please call [phone] between 1PM - 3PM on April 14, 2017 [facility] )  Remember: Instructions that are not followed completely may result in serious medical risk, up to and including death, or upon the discretion of your surgeon and anesthesiologist your surgery may need to be rescheduled.    _x___ 1. Do not eat food or drink liquids after midnight. No gum chewing or hard candies                                __x__ 2. No Alcohol for 24 hours before or after surgery.   __x__3. No Smoking for 24 prior to surgery.   ____  4. Bring all medications with you on the day of surgery if instructed.    __x__ 5. Notify your doctor if there is any change in your medical condition     (cold, fever, infections).     Do not wear jewelry, make-up, hairpins, clips or nail polish.  Do not wear lotions, powders, or perfumes.  Do not shave 48 hours prior to surgery. Men may shave face and neck.  Do not bring valuables to the hospital.    [facility] is not responsible for any belongings or valuables.               Contacts, dentures or bridgework may not be worn into surgery.  Leave your suitcase in the car. After surgery it may be brought to your room.  For patients admitted to the hospital, discharge time is determined by your  treatment team                      Patients discharged the day of surgery will not be allowed to drive home.  You will need someone to drive you home and stay with you the night of your procedure.    Please read over the following fact sheets that you were given:   [facility] Preparing for Surgery and or MRSA Information   TAKE THE FOLLOWING MEDICATIONS THE MORNING OF SURGERY WITH A SIP OF WATER :  1. AMLODIPINE  2.  METOPROLOL  3. ZOLOFT  4. OMEPRAZOLE (OMEPRAZOLE AT BEDTIME ON AUGUST 15 ) .    ____Fleets enema or Magnesium Citrate as directed.   _x___ Use CHG Soap or sage wipes as directed on instruction sheet   ____ Use inhalers on the day of surgery and bring to hospital day of surgery  ____ Stop Metformin and Janumet 2 days prior to surgery.    ____ Take 1/2 of usual insulin dose the night before surgery and none on the morning surgery    _x___ Follow recommendations from Cardiologist, Pulmonologist or PCP regarding          stopping Aspirin, Coumadin, Plavix ,Eliquis, Effient, or Pradaxa, and Pletal.  X____Stop Anti-inflammatories such as Advil, Aleve, Ibuprofen, Motrin, Naproxen, Naprosyn, Goodies powders or aspirin products. OK to take Tylenol  (STOP ALEVE NOW )   _x___ Stop supplements until after surgery.  But may continue Vitamin D, Vitamin B, and multivitamin      ____ Bring C-Pap to the hospital.

## 2017-04-08 NOTE — Pre-Procedure Instructions (Signed)
EKG READ BY DR Rosey Bath AND OK TO Russella Dar. CLEARANCE BY DR PARASCHOS ON CHART LOW RISK

## 2017-04-09 NOTE — Pre-Procedure Instructions (Signed)
Elevated platelet results sent to Dr. Mack Guise and Anesthesia for review.

## 2017-04-12 NOTE — Pre-Procedure Instructions (Signed)
plt count [phone] noted

## 2017-04-14 MED ORDER — CEFAZOLIN SODIUM-DEXTROSE 2-4 GM/100ML-% IV SOLN
2.0000 g | INTRAVENOUS | Status: AC
  Administered 2017-04-15: 2 g via INTRAVENOUS

## 2017-04-15 ENCOUNTER — Ambulatory Visit: Payer: Medicare Other | Admitting: Certified Registered"

## 2017-04-15 ENCOUNTER — Encounter: Payer: Self-pay | Admitting: *Deleted

## 2017-04-15 ENCOUNTER — Encounter
Admission: RE | Disposition: A | Discharge status: Home Health | Payer: Self-pay | Source: Ambulatory Visit | Attending: Orthopedic Surgery

## 2017-04-15 ENCOUNTER — Observation Stay
Admission: RE | Admit: 2017-04-15 | Discharge: 2017-04-17 | Disposition: A | Discharge status: Home Health | Payer: Medicare Other | Source: Ambulatory Visit | Attending: Orthopedic Surgery | Admitting: Orthopedic Surgery

## 2017-04-15 DIAGNOSIS — Z86711 Personal history of pulmonary embolism: Secondary | ICD-10-CM | POA: Diagnosis not present

## 2017-04-15 DIAGNOSIS — K219 Gastro-esophageal reflux disease without esophagitis: Secondary | ICD-10-CM | POA: Diagnosis not present

## 2017-04-15 DIAGNOSIS — Z8546 Personal history of malignant neoplasm of prostate: Secondary | ICD-10-CM | POA: Insufficient documentation

## 2017-04-15 DIAGNOSIS — E785 Hyperlipidemia, unspecified: Secondary | ICD-10-CM | POA: Diagnosis not present

## 2017-04-15 DIAGNOSIS — F418 Other specified anxiety disorders: Secondary | ICD-10-CM | POA: Insufficient documentation

## 2017-04-15 DIAGNOSIS — M65819 Other synovitis and tenosynovitis, unspecified shoulder: Secondary | ICD-10-CM | POA: Insufficient documentation

## 2017-04-15 DIAGNOSIS — Z89612 Acquired absence of left leg above knee: Secondary | ICD-10-CM | POA: Diagnosis not present

## 2017-04-15 DIAGNOSIS — M659 Synovitis and tenosynovitis, unspecified: Secondary | ICD-10-CM | POA: Diagnosis not present

## 2017-04-15 DIAGNOSIS — M75122 Complete rotator cuff tear or rupture of left shoulder, not specified as traumatic: Secondary | ICD-10-CM | POA: Diagnosis present

## 2017-04-15 DIAGNOSIS — M19012 Primary osteoarthritis, left shoulder: Secondary | ICD-10-CM | POA: Diagnosis not present

## 2017-04-15 DIAGNOSIS — I252 Old myocardial infarction: Secondary | ICD-10-CM | POA: Diagnosis not present

## 2017-04-15 DIAGNOSIS — F0390 Unspecified dementia without behavioral disturbance: Secondary | ICD-10-CM | POA: Diagnosis not present

## 2017-04-15 DIAGNOSIS — Z87442 Personal history of urinary calculi: Secondary | ICD-10-CM | POA: Insufficient documentation

## 2017-04-15 DIAGNOSIS — Z79899 Other long term (current) drug therapy: Secondary | ICD-10-CM | POA: Diagnosis not present

## 2017-04-15 DIAGNOSIS — F329 Major depressive disorder, single episode, unspecified: Secondary | ICD-10-CM | POA: Insufficient documentation

## 2017-04-15 DIAGNOSIS — M48061 Spinal stenosis, lumbar region without neurogenic claudication: Secondary | ICD-10-CM | POA: Diagnosis not present

## 2017-04-15 DIAGNOSIS — M7542 Impingement syndrome of left shoulder: Secondary | ICD-10-CM | POA: Insufficient documentation

## 2017-04-15 DIAGNOSIS — Z96659 Presence of unspecified artificial knee joint: Secondary | ICD-10-CM | POA: Diagnosis not present

## 2017-04-15 DIAGNOSIS — Z9889 Other specified postprocedural states: Secondary | ICD-10-CM

## 2017-04-15 DIAGNOSIS — I4510 Unspecified right bundle-branch block: Secondary | ICD-10-CM | POA: Diagnosis not present

## 2017-04-15 DIAGNOSIS — I700 Atherosclerosis of aorta: Secondary | ICD-10-CM | POA: Insufficient documentation

## 2017-04-15 DIAGNOSIS — Z8042 Family history of malignant neoplasm of prostate: Secondary | ICD-10-CM | POA: Insufficient documentation

## 2017-04-15 DIAGNOSIS — F419 Anxiety disorder, unspecified: Secondary | ICD-10-CM | POA: Diagnosis not present

## 2017-04-15 DIAGNOSIS — R0602 Shortness of breath: Secondary | ICD-10-CM

## 2017-04-15 DIAGNOSIS — I10 Essential (primary) hypertension: Secondary | ICD-10-CM | POA: Diagnosis not present

## 2017-04-15 DIAGNOSIS — E7800 Pure hypercholesterolemia, unspecified: Secondary | ICD-10-CM | POA: Diagnosis not present

## 2017-04-15 DIAGNOSIS — R0902 Hypoxemia: Secondary | ICD-10-CM | POA: Insufficient documentation

## 2017-04-15 DIAGNOSIS — Z96649 Presence of unspecified artificial hip joint: Secondary | ICD-10-CM | POA: Insufficient documentation

## 2017-04-15 HISTORY — PX: SHOULDER ARTHROSCOPY WITH OPEN ROTATOR CUFF REPAIR: SHX6092

## 2017-04-15 LAB — MRSA PCR SCREENING: MRSA by PCR: NEGATIVE

## 2017-04-15 SURGERY — ARTHROSCOPY, SHOULDER WITH REPAIR, ROTATOR CUFF, OPEN
Anesthesia: General | Site: Shoulder | Laterality: Left | Wound class: Clean

## 2017-04-15 MED ORDER — BUPIVACAINE HCL (PF) 0.25 % IJ SOLN
INTRAMUSCULAR | Status: DC | PRN
  Administered 2017-04-15: 30 mL

## 2017-04-15 MED ORDER — FENTANYL CITRATE (PF) 100 MCG/2ML IJ SOLN
INTRAMUSCULAR | Status: DC | PRN
  Administered 2017-04-15: 50 ug via INTRAVENOUS
  Administered 2017-04-15: 100 ug via INTRAVENOUS

## 2017-04-15 MED ORDER — ROCURONIUM BROMIDE 100 MG/10ML IV SOLN
INTRAVENOUS | Status: DC | PRN
  Administered 2017-04-15: 5 mg via INTRAVENOUS
  Administered 2017-04-15: 45 mg via INTRAVENOUS
  Administered 2017-04-15 (×2): 10 mg via INTRAVENOUS

## 2017-04-15 MED ORDER — SUCCINYLCHOLINE CHLORIDE 20 MG/ML IJ SOLN
INTRAMUSCULAR | Status: AC
  Filled 2017-04-15: qty 1

## 2017-04-15 MED ORDER — LISINOPRIL 20 MG PO TABS
20.0000 mg | ORAL_TABLET | Freq: Every day | ORAL | Status: DC
  Administered 2017-04-16 – 2017-04-17 (×2): 20 mg via ORAL
  Filled 2017-04-15 (×2): qty 1

## 2017-04-15 MED ORDER — ACETAMINOPHEN 650 MG RE SUPP: 650.0000 mg | Freq: Four times a day (QID) | RECTAL | Status: DC | PRN

## 2017-04-15 MED ORDER — EPINEPHRINE PF 1 MG/ML IJ SOLN
INTRAMUSCULAR | Status: DC | PRN
  Administered 2017-04-15: 19 mL

## 2017-04-15 MED ORDER — PHENYLEPHRINE HCL 10 MG/ML IJ SOLN
INTRAMUSCULAR | Status: AC
  Filled 2017-04-15: qty 1

## 2017-04-15 MED ORDER — HYDROMORPHONE HCL 1 MG/ML IJ SOLN
0.5000 mg | INTRAMUSCULAR | Status: DC | PRN
Start: 2017-04-15 — End: 2017-04-17

## 2017-04-15 MED ORDER — METHOCARBAMOL 1000 MG/10ML IJ SOLN
500.0000 mg | Freq: Four times a day (QID) | INTRAMUSCULAR | Status: DC | PRN
  Filled 2017-04-15: qty 5

## 2017-04-15 MED ORDER — OXYCODONE HCL 5 MG/5ML PO SOLN: 5.0000 mg | Freq: Once | ORAL | Status: AC | PRN

## 2017-04-15 MED ORDER — FENTANYL CITRATE (PF) 100 MCG/2ML IJ SOLN
INTRAMUSCULAR | Status: AC
  Filled 2017-04-15: qty 2

## 2017-04-15 MED ORDER — CEFAZOLIN SODIUM-DEXTROSE 1-4 GM/50ML-% IV SOLN
1.0000 g | Freq: Four times a day (QID) | INTRAVENOUS | Status: AC
  Administered 2017-04-15 – 2017-04-16 (×3): 1 g via INTRAVENOUS
  Filled 2017-04-15 (×4): qty 50

## 2017-04-15 MED ORDER — HYDROMORPHONE HCL 1 MG/ML IJ SOLN
0.2500 mg | INTRAMUSCULAR | Status: DC | PRN
  Administered 2017-04-15 (×4): 0.25 mg via INTRAVENOUS

## 2017-04-15 MED ORDER — PHENYLEPHRINE HCL 10 MG/ML IJ SOLN
INTRAMUSCULAR | Status: DC | PRN
  Administered 2017-04-15: 100 ug via INTRAVENOUS
  Administered 2017-04-15 (×2): 200 ug via INTRAVENOUS

## 2017-04-15 MED ORDER — HYDROMORPHONE HCL 1 MG/ML IJ SOLN: 0.2500 mg | INTRAMUSCULAR | Status: DC | PRN

## 2017-04-15 MED ORDER — ONDANSETRON HCL 4 MG PO TABS: 4.0000 mg | ORAL_TABLET | Freq: Four times a day (QID) | ORAL | Status: DC | PRN

## 2017-04-15 MED ORDER — METHOCARBAMOL 500 MG PO TABS
500.0000 mg | ORAL_TABLET | Freq: Four times a day (QID) | ORAL | Status: DC | PRN
Start: 2017-04-15 — End: 2017-04-17

## 2017-04-15 MED ORDER — EPHEDRINE SULFATE 50 MG/ML IJ SOLN
INTRAMUSCULAR | Status: DC | PRN
  Administered 2017-04-15: 15 mg via INTRAVENOUS
  Administered 2017-04-15: 10 mg via INTRAVENOUS

## 2017-04-15 MED ORDER — SODIUM CHLORIDE 0.9 % IJ SOLN
INTRAMUSCULAR | Status: AC
  Filled 2017-04-15: qty 10

## 2017-04-15 MED ORDER — LIDOCAINE HCL (PF) 1 % IJ SOLN
INTRAMUSCULAR | Status: DC | PRN
Start: 2017-04-15 — End: 2017-04-15
  Administered 2017-04-15: 15 mL

## 2017-04-15 MED ORDER — METOPROLOL SUCCINATE ER 50 MG PO TB24
50.0000 mg | ORAL_TABLET | Freq: Every day | ORAL | Status: DC
  Administered 2017-04-16 – 2017-04-17 (×2): 50 mg via ORAL
  Filled 2017-04-15 (×2): qty 1

## 2017-04-15 MED ORDER — AMLODIPINE BESYLATE 5 MG PO TABS
5.0000 mg | ORAL_TABLET | Freq: Every day | ORAL | Status: DC
  Administered 2017-04-16 – 2017-04-17 (×2): 5 mg via ORAL
  Filled 2017-04-15 (×2): qty 1

## 2017-04-15 MED ORDER — SUCCINYLCHOLINE CHLORIDE 20 MG/ML IJ SOLN
INTRAMUSCULAR | Status: DC | PRN
  Administered 2017-04-15: 100 mg via INTRAVENOUS

## 2017-04-15 MED ORDER — CHLORHEXIDINE GLUCONATE CLOTH 2 % EX PADS: 6.0000 | MEDICATED_PAD | Freq: Once | CUTANEOUS | Status: DC

## 2017-04-15 MED ORDER — EPHEDRINE SULFATE 50 MG/ML IJ SOLN
INTRAMUSCULAR | Status: AC
  Filled 2017-04-15: qty 1

## 2017-04-15 MED ORDER — FENTANYL CITRATE (PF) 100 MCG/2ML IJ SOLN
25.0000 ug | INTRAMUSCULAR | Status: AC | PRN
  Administered 2017-04-15 (×6): 25 ug via INTRAVENOUS

## 2017-04-15 MED ORDER — ETOMIDATE 2 MG/ML IV SOLN
INTRAVENOUS | Status: DC | PRN
  Administered 2017-04-15: 18 mg via INTRAVENOUS

## 2017-04-15 MED ORDER — ACETAMINOPHEN 10 MG/ML IV SOLN
INTRAVENOUS | Status: DC | PRN
  Administered 2017-04-15: 1000 mg via INTRAVENOUS

## 2017-04-15 MED ORDER — OXYCODONE HCL 5 MG PO TABS
5.0000 mg | ORAL_TABLET | ORAL | Status: DC | PRN
  Administered 2017-04-16: 5 mg via ORAL
  Administered 2017-04-16 (×2): 10 mg via ORAL
  Filled 2017-04-15: qty 2
  Filled 2017-04-15: qty 1
  Filled 2017-04-15: qty 2

## 2017-04-15 MED ORDER — SENNA 8.6 MG PO TABS
1.0000 | ORAL_TABLET | Freq: Two times a day (BID) | ORAL | Status: DC
  Administered 2017-04-15 – 2017-04-17 (×4): 8.6 mg via ORAL
  Filled 2017-04-15 (×4): qty 1

## 2017-04-15 MED ORDER — ONDANSETRON HCL 4 MG/2ML IJ SOLN
INTRAMUSCULAR | Status: DC | PRN
  Administered 2017-04-15: 4 mg via INTRAVENOUS

## 2017-04-15 MED ORDER — MEPERIDINE HCL 50 MG/ML IJ SOLN: 6.2500 mg | INTRAMUSCULAR | Status: DC | PRN

## 2017-04-15 MED ORDER — ACETAMINOPHEN 325 MG PO TABS: 650.0000 mg | ORAL_TABLET | ORAL | 1 refills | Status: AC | PRN

## 2017-04-15 MED ORDER — LOPERAMIDE HCL 2 MG PO CAPS: 2.0000 mg | ORAL_CAPSULE | Freq: Four times a day (QID) | ORAL | Status: DC | PRN

## 2017-04-15 MED ORDER — LISINOPRIL-HYDROCHLOROTHIAZIDE 20-12.5 MG PO TABS: 1.0000 | ORAL_TABLET | Freq: Every day | ORAL | Status: DC

## 2017-04-15 MED ORDER — LACTATED RINGERS IV SOLN
INTRAVENOUS | Status: DC
  Administered 2017-04-15 (×2): via INTRAVENOUS

## 2017-04-15 MED ORDER — HYDROCHLOROTHIAZIDE 12.5 MG PO CAPS
12.5000 mg | ORAL_CAPSULE | Freq: Every day | ORAL | Status: DC
  Administered 2017-04-16 – 2017-04-17 (×2): 12.5 mg via ORAL
  Filled 2017-04-15 (×2): qty 1

## 2017-04-15 MED ORDER — ROCURONIUM BROMIDE 50 MG/5ML IV SOLN
INTRAVENOUS | Status: AC
  Filled 2017-04-15: qty 1

## 2017-04-15 MED ORDER — ONDANSETRON HCL 4 MG/2ML IJ SOLN
INTRAMUSCULAR | Status: AC
  Filled 2017-04-15: qty 2

## 2017-04-15 MED ORDER — ADULT MULTIVITAMIN W/MINERALS CH
1.0000 | ORAL_TABLET | Freq: Every day | ORAL | Status: DC
  Administered 2017-04-16 – 2017-04-17 (×2): 1 via ORAL
  Filled 2017-04-15 (×2): qty 1

## 2017-04-15 MED ORDER — ONDANSETRON HCL 4 MG/2ML IJ SOLN
4.0000 mg | Freq: Once | INTRAMUSCULAR | Status: AC
  Administered 2017-04-15: 4 mg via INTRAVENOUS

## 2017-04-15 MED ORDER — OXYCODONE HCL 5 MG PO TABS
5.0000 mg | ORAL_TABLET | Freq: Once | ORAL | Status: AC | PRN
  Administered 2017-04-15: 5 mg via ORAL

## 2017-04-15 MED ORDER — DOCUSATE SODIUM 100 MG PO CAPS
100.0000 mg | ORAL_CAPSULE | Freq: Two times a day (BID) | ORAL | Status: DC
  Administered 2017-04-15 – 2017-04-17 (×4): 100 mg via ORAL
  Filled 2017-04-15 (×4): qty 1

## 2017-04-15 MED ORDER — NEOMYCIN-POLYMYXIN B GU 40-200000 IR SOLN
Status: DC | PRN
  Administered 2017-04-15: 2 mL

## 2017-04-15 MED ORDER — ONDANSETRON HCL 4 MG/2ML IJ SOLN
4.0000 mg | Freq: Four times a day (QID) | INTRAMUSCULAR | Status: DC | PRN
  Administered 2017-04-16 – 2017-04-17 (×2): 4 mg via INTRAVENOUS
  Filled 2017-04-15 (×2): qty 2

## 2017-04-15 MED ORDER — OXYCODONE HCL 5 MG PO TABS: 5.0000 mg | ORAL_TABLET | ORAL | 0 refills | Status: DC | PRN

## 2017-04-15 MED ORDER — DONEPEZIL HCL 5 MG PO TABS
5.0000 mg | ORAL_TABLET | Freq: Every day | ORAL | Status: DC
  Administered 2017-04-15 – 2017-04-16 (×2): 5 mg via ORAL
  Filled 2017-04-15 (×3): qty 1

## 2017-04-15 MED ORDER — LIDOCAINE HCL (CARDIAC) 20 MG/ML IV SOLN
INTRAVENOUS | Status: DC | PRN
  Administered 2017-04-15: 80 mg via INTRAVENOUS

## 2017-04-15 MED ORDER — MAGNESIUM CITRATE PO SOLN
1.0000 | Freq: Once | ORAL | Status: DC | PRN
  Filled 2017-04-15: qty 296

## 2017-04-15 MED ORDER — ETOMIDATE 2 MG/ML IV SOLN
INTRAVENOUS | Status: AC
  Filled 2017-04-15: qty 10

## 2017-04-15 MED ORDER — SERTRALINE HCL 50 MG PO TABS
50.0000 mg | ORAL_TABLET | Freq: Every day | ORAL | Status: DC
  Administered 2017-04-16 – 2017-04-17 (×2): 50 mg via ORAL
  Filled 2017-04-15 (×2): qty 1

## 2017-04-15 MED ORDER — ALUM & MAG HYDROXIDE-SIMETH 200-200-20 MG/5ML PO SUSP: 30.0000 mL | ORAL | Status: DC | PRN

## 2017-04-15 MED ORDER — PROPOFOL 10 MG/ML IV BOLUS
INTRAVENOUS | Status: AC
  Filled 2017-04-15: qty 20

## 2017-04-15 MED ORDER — SODIUM CHLORIDE 0.9 % IV SOLN
INTRAVENOUS | Status: DC | PRN
  Administered 2017-04-15: 25 ug/min via INTRAVENOUS
  Administered 2017-04-15: 35 ug/min via INTRAVENOUS

## 2017-04-15 MED ORDER — IBUPROFEN 200 MG PO TABS: 200.0000 mg | ORAL_TABLET | ORAL | 2 refills | Status: AC | PRN

## 2017-04-15 MED ORDER — CHLORHEXIDINE GLUCONATE CLOTH 2 % EX PADS
6.0000 | MEDICATED_PAD | Freq: Once | CUTANEOUS | Status: AC
  Administered 2017-04-15: 6 via TOPICAL

## 2017-04-15 MED ORDER — BISACODYL 10 MG RE SUPP: 10.0000 mg | Freq: Every day | RECTAL | Status: DC | PRN

## 2017-04-15 MED ORDER — SUGAMMADEX SODIUM 200 MG/2ML IV SOLN
INTRAVENOUS | Status: DC | PRN
  Administered 2017-04-15: 160 mg via INTRAVENOUS

## 2017-04-15 MED ORDER — ACETAMINOPHEN 325 MG PO TABS
650.0000 mg | ORAL_TABLET | Freq: Four times a day (QID) | ORAL | Status: DC | PRN
  Administered 2017-04-17: 650 mg via ORAL
  Filled 2017-04-15: qty 2

## 2017-04-15 MED ORDER — PANTOPRAZOLE SODIUM 40 MG PO TBEC
40.0000 mg | DELAYED_RELEASE_TABLET | Freq: Every day | ORAL | Status: DC
  Administered 2017-04-16 – 2017-04-17 (×2): 40 mg via ORAL
  Filled 2017-04-15 (×2): qty 1

## 2017-04-15 MED ORDER — PHENOL 1.4 % MT LIQD
1.0000 | OROMUCOSAL | Status: DC | PRN
  Filled 2017-04-15: qty 177

## 2017-04-15 MED ORDER — POTASSIUM CHLORIDE IN NACL 20-0.45 MEQ/L-% IV SOLN
INTRAVENOUS | Status: DC
  Administered 2017-04-15 – 2017-04-16 (×2): via INTRAVENOUS
  Filled 2017-04-15 (×3): qty 1000

## 2017-04-15 MED ORDER — POLYETHYLENE GLYCOL 3350 17 G PO PACK: 17.0000 g | PACK | Freq: Every day | ORAL | Status: DC | PRN

## 2017-04-15 MED ORDER — PROMETHAZINE HCL 25 MG/ML IJ SOLN
6.2500 mg | INTRAMUSCULAR | Status: DC | PRN
  Administered 2017-04-15: 6.25 mg via INTRAVENOUS

## 2017-04-15 MED ORDER — ACETAMINOPHEN 10 MG/ML IV SOLN
INTRAVENOUS | Status: AC
  Filled 2017-04-15: qty 100

## 2017-04-15 MED ORDER — LIDOCAINE HCL (PF) 2 % IJ SOLN
INTRAMUSCULAR | Status: AC
  Filled 2017-04-15: qty 2

## 2017-04-15 MED ORDER — MENTHOL 3 MG MT LOZG
1.0000 | LOZENGE | OROMUCOSAL | Status: DC | PRN
  Filled 2017-04-15: qty 9

## 2017-04-15 MED ORDER — SUGAMMADEX SODIUM 200 MG/2ML IV SOLN
INTRAVENOUS | Status: AC
  Filled 2017-04-15: qty 2

## 2017-04-15 SURGICAL SUPPLY — 74 items
ADAPTER IRRIG TUBE 2 SPIKE SOL (ADAPTER) ×6 IMPLANT
BUR RADIUS 4.0X18.5 (BURR) ×3 IMPLANT
BUR RADIUS 5.5 (BURR) ×3 IMPLANT
CANISTER SUCT LVC 12 LTR MEDI- (MISCELLANEOUS) ×3 IMPLANT
CANNULA 5.75X7 CRYSTAL CLEAR (CANNULA) ×3 IMPLANT
CANNULA PARTIAL THREAD 2X7 (CANNULA) ×3 IMPLANT
CANNULA TWIST IN 8.25X9CM (CANNULA) IMPLANT
CLOSURE WOUND 1/2 X4 (GAUZE/BANDAGES/DRESSINGS) ×1
CONNECTOR PERFECT PASSER (CONNECTOR) ×6 IMPLANT
COOLER POLAR GLACIER W/PUMP (MISCELLANEOUS) ×3 IMPLANT
CRADLE LAMINECT ARM (MISCELLANEOUS) ×3 IMPLANT
DEVICE SUCT BLK HOLE OR FLOOR (MISCELLANEOUS) ×9 IMPLANT
DRAPE IMP U-DRAPE 54X76 (DRAPES) ×6 IMPLANT
DRAPE INCISE IOBAN 66X45 STRL (DRAPES) ×3 IMPLANT
DRAPE SHEET LG 3/4 BI-LAMINATE (DRAPES) ×3 IMPLANT
DRAPE U-SHAPE 47X51 STRL (DRAPES) ×3 IMPLANT
DURAPREP 26ML APPLICATOR (WOUND CARE) ×9 IMPLANT
ELECT REM PT RETURN 9FT ADLT (ELECTROSURGICAL) ×3
ELECTRODE REM PT RTRN 9FT ADLT (ELECTROSURGICAL) ×1 IMPLANT
GAUZE PETRO XEROFOAM 1X8 (MISCELLANEOUS) ×3 IMPLANT
GAUZE SPONGE 4X4 12PLY STRL (GAUZE/BANDAGES/DRESSINGS) ×3 IMPLANT
GAUZE XEROFORM 4X4 STRL (GAUZE/BANDAGES/DRESSINGS) ×3 IMPLANT
GLOVE BIO SURGEON STRL SZ7 (GLOVE) ×9 IMPLANT
GLOVE BIOGEL PI IND STRL 9 (GLOVE) ×1 IMPLANT
GLOVE BIOGEL PI INDICATOR 9 (GLOVE) ×2
GLOVE INDICATOR 7.5 STRL GRN (GLOVE) ×9 IMPLANT
GLOVE SURG 9.0 ORTHO LTXF (GLOVE) ×6 IMPLANT
GOWN STRL REUS TWL 2XL XL LVL4 (GOWN DISPOSABLE) ×3 IMPLANT
GOWN STRL REUS W/ TWL LRG LVL3 (GOWN DISPOSABLE) ×1 IMPLANT
GOWN STRL REUS W/ TWL LRG LVL4 (GOWN DISPOSABLE) ×2 IMPLANT
GOWN STRL REUS W/TWL LRG LVL3 (GOWN DISPOSABLE) ×2
GOWN STRL REUS W/TWL LRG LVL4 (GOWN DISPOSABLE) ×4
IV LACTATED RINGER IRRG 3000ML (IV SOLUTION) ×50
IV LR IRRIG 3000ML ARTHROMATIC (IV SOLUTION) ×25 IMPLANT
KIT RM TURNOVER STRD PROC AR (KITS) ×3 IMPLANT
KIT STABILIZATION SHOULDER (MISCELLANEOUS) ×3 IMPLANT
KIT SUTURE 2.8 Q-FIX DISP (MISCELLANEOUS) IMPLANT
KIT SUTURETAK 3.0 INSERT PERC (KITS) IMPLANT
MANIFOLD NEPTUNE II (INSTRUMENTS) ×3 IMPLANT
MASK FACE SPIDER DISP (MASK) ×3 IMPLANT
MAT BLUE FLOOR 46X72 FLO (MISCELLANEOUS) ×6 IMPLANT
NDL SAFETY 18GX1.5 (NEEDLE) ×3 IMPLANT
NDL SAFETY 22GX1.5 (NEEDLE) ×3 IMPLANT
NS IRRIG 500ML POUR BTL (IV SOLUTION) ×6 IMPLANT
PACK ARTHROSCOPY SHOULDER (MISCELLANEOUS) ×3 IMPLANT
PAD WRAPON POLAR SHDR XLG (MISCELLANEOUS) ×1 IMPLANT
PASSER SUT CAPTURE FIRST (SUTURE) IMPLANT
SET TUBE SUCT SHAVER OUTFL 24K (TUBING) ×3 IMPLANT
SET TUBE TIP INTRA-ARTICULAR (MISCELLANEOUS) ×3 IMPLANT
STRAP SAFETY BODY (MISCELLANEOUS) ×3 IMPLANT
STRIP CLOSURE SKIN 1/2X4 (GAUZE/BANDAGES/DRESSINGS) ×2 IMPLANT
SUT ETHILON 4-0 (SUTURE) ×4
SUT ETHILON 4-0 FS2 18XMFL BLK (SUTURE) ×2
SUT KNTLS 2.8 MAGNUM (Anchor) ×9 IMPLANT
SUT LASSO 90 DEG SD STR (SUTURE) IMPLANT
SUT MNCRL 4-0 (SUTURE) ×2
SUT MNCRL 4-0 27XMFL (SUTURE) ×1
SUT PDS AB 0 CT1 27 (SUTURE) IMPLANT
SUT PERFECTPASSER WHITE CART (SUTURE) ×21 IMPLANT
SUT SMART STITCH CARTRIDGE (SUTURE) ×15 IMPLANT
SUT VIC AB 0 CT1 36 (SUTURE) ×3 IMPLANT
SUT VIC AB 2-0 CT2 27 (SUTURE) ×3 IMPLANT
SUTURE ETHLN 4-0 FS2 18XMF BLK (SUTURE) ×2 IMPLANT
SUTURE MAGNUM WIRE 2X48 BLK (SUTURE) IMPLANT
SUTURE MNCRL 4-0 27XMF (SUTURE) ×1 IMPLANT
SYR 10ML 18GX1 1/2 (NEEDLE) ×3 IMPLANT
SYR 3ML 18GX1 1/2 (SYRINGE) ×3 IMPLANT
SYRINGE 10CC LL (SYRINGE) ×3 IMPLANT
TAPE MICROFOAM 4IN (TAPE) ×3 IMPLANT
TUBING ARTHRO INFLOW-ONLY STRL (TUBING) ×3 IMPLANT
TUBING CONNECTING 10 (TUBING) ×4 IMPLANT
TUBING CONNECTING 10' (TUBING) ×2
WAND HAND CNTRL MULTIVAC 90 (MISCELLANEOUS) ×6 IMPLANT
WRAPON POLAR PAD SHDR XLG (MISCELLANEOUS) ×3

## 2017-04-15 NOTE — Progress Notes (Signed)
Shoulder immobilizer on polar care on   Doing a lot better

## 2017-04-15 NOTE — Progress Notes (Signed)
Patient admitted to unit. Alert and oriented x 4. Admission process and skin assessment completed. Patient was educated to use call light, however patient seems forgetful. Bed in lowest position with bed alarm.  Wynema Birch, RN

## 2017-04-15 NOTE — Discharge Instructions (Signed)

## 2017-04-15 NOTE — Progress Notes (Signed)
Family at bedside. 

## 2017-04-15 NOTE — Progress Notes (Signed)
Continues to have pain  His breathing is better    Nausea better can take crackers and drink

## 2017-04-15 NOTE — Op Note (Signed)
04/15/2017  12:40 PM  PATIENT:  Stephen Fields  83 y.o. male  PRE-OPERATIVE DIAGNOSIS:  Full-thickness tear of left rotator cuff with retraction  POST-OPERATIVE DIAGNOSIS:  Large V-shaped full-thickness rotator cuff tear with retraction, grade 4 chondral lesion of the humeral head, glenohumeral joint synovitis, partial thickness tear of the long head of biceps tendon, subacromial impingement and acromioclavicular joint arthrosis  PROCEDURE:  Procedure(s): LEFT SHOULDER ARTHROSCOPY WITH MINI OPEN ROTATOR CUFF REPAIR,  ARTHROSCOPIC SUBACROMINAL DECOMPRESSION,  ARTHROSCOPIC DISTAL CLAVICLE EXCISION,  ARTHROSCOPIC BICEPS TENOTOMY,  ARTHROSCOPIC SYNOVECTOMY, ARTHROSCOPIC CHRONDROPLASTY OF THE HUMERAL HEAD   SURGEON:  Surgeon(s) and Role:    Thornton Park, MD - Primary  ANESTHESIA:   local and general   PREOPERATIVE INDICATIONS:  Stephen Fields is a  83 y.o. male with a diagnosis of M75.122 Complete rotatr-cuff tear/ruptr of left shoulder, not trauma who failed conservative measures and elected for surgical management.    The risks benefits and alternatives were discussed with the patient preoperatively including but not limited to the risks of infection, bleeding, nerve injury, persistent pain or weakness, failure of the hardware, re-tear of the rotator cuff and the need for further surgery. Medical risks include DVT and pulmonary embolism, myocardial infarction, stroke, pneumonia, respiratory failure and death. Patient understood these risks and wished to proceed.  OPERATIVE IMPLANTS: ArthroCare Magnum 2 anchors x 3   OPERATIVE PROCEDURE: The patient was met in the preoperative area. The left shoulder was signed with the word yes and my initials according the hospital's correct site of surgery protocol. The patient was brought to the OR and underwent  general endotracheal intubation by the anesthesia service.  The patient was placed in a beachchair position. A spider arm positioner was  used for this case. Examination under anesthesia revealed no significant limitation of motion and no instability to load shift testing. Patient negative sulcus sign..  The patient was prepped and draped in a sterile fashion. A timeout was performed to verify the patient's name, date of birth, medical record number, correct site of surgery and correct procedure to be performed there was also used to verify the patient received antibiotics that all appropriate instruments, implants and radiographs studies were available in the room. Once all in attendance were in agreement case began.  Bony landmarks were drawn out with a surgical marker along with proposed arthroscopy incisions. These were pre-injected with 1% lidocaine plain. An 11 blade was used to establish a posterior portal through which the arthroscope was placed in the glenohumeral joint. A full diagnostic examination of the shoulder was performed. The anterior portal was established under direct visualization with an 18-gauge spinal needle.  A 5.75 mm arthroscopic cannula was placed through the anterior portal.   The intra-articular portion of the biceps tendon was found to have a partial tear involving greater than 50% of the diameter. Therefore the decision was made to perform a tenotomy. A 90 ArthroCare wand was used to release the biceps tendon off the superior labrum. The arthroscopic shaver was then used to debride the frayed edges of the labrum. There were no anterior or superior labral tears seen.  Subscapularis tendon was intact. Patient had a full-thickness tear involving the supraspinatus and infraspinatuswith retraction. There were no loose bodies within the inferior recess and no evidence of HAGL lesion.  Patient significant glenohumeral joint synovitis particularly anteriorly. This was debrided with an 90 ArthroCare wand.  A chondroplasty was also performed of the this chondral edges of the grade 4  chondral lesion of thehumeral head  using a 4.0 mm resector shaver blade.  The arthroscope was then placed in the subacromial space. A lateral portal was then established using an 18-gauge spinal needle for localization.   The greater tuberosity was debrided using a 5.5 mm resector shaver blade to remove all remaining foreign fibers of the rotator cuff.  Debridement was performed until punctate bleeding was seen at the greater tuberosity footprint, which will allow for rotator cuff healing.  Extensive bursitis was encountered and debrided using a 4-0 resector shaver blade and a 90 ArthroCare wand from the lateral portal. A subacromial decompression was also performed using a 5.5 mm resector shaver blade from the lateral portal. The 5.5 mm resector shaver blade was then placed through the anterior portal and distal clavicle excision was performed.   The tear was characterized as a large V-shaped tear requiring side-to-side repair. ArthroCare perfect pass sutures were used for side-to-side repair. 6 side-to-side sutures were used to bring the tear together.  Arthroscopic images of the side-to-side repair were taken.  Three ArthroCare Perfect Pass sutures were placed in the lateral border of the rotator cuff tear. All arthroscopic instruments were then removed and the mini-open portion of the procedure began.   A saber-type incision was made along the lateral border of the acromion. The deltoid muscle was identified and split in line with its fibers which allowed visualization of the rotator cuff. The Perfect Pass sutures previously placed in the lateral border of the rotator cuff werealso brought out through the deltoid split. The Perfect Pass sutures from the lateral border of the rotator cuff were anchored to thegreater tuberosity of the humeral head using three Magnum 2 anchors. These anchors were tensioned to allow for anatomic reduction of the rotator cuff to the greater tuberosity footprint. Once all sutures were tied down,  arthroscopic images of the double row repair were taken with the arthroscope both externally and arthroscopically fromthe glenohumeral joint  All incisions were copiously irrigated. The deltoid fascia was repaired using a 0 Vicryl suturean interrupted fashion. The subcutaneous tissue of all incisions were closed with a 2-0 Vicryl. Skin closure for the arthroscopic incisions was performed with 4-0 nylon. The skin edges of the saber incision were approximated with a running 4-0 undyed Monocryl. 0.25%  Marcaine plain was injected into the subacromial space and at the injection sites.  A dry sterile dressing including Steri-Strips was applied . The patient was placed in an abduction sling, with a Polar Care sleeve.  All sharp and instrument counts were correct at the conclusion of the case. I was scrubbed and present for the entire case. I spoke with the patient's family in the post-op consultation room and informed them that the case had been performed without complication and the patient was stable in recovery room.     Timoteo Gaul, MD

## 2017-04-15 NOTE — Progress Notes (Signed)
  Subjective:  POST OP NOTE:  Patient reports left shoulder pain as mild.  Patient was noted to have severe pain in PACU.  Patient slightly confused.   Asking about a foley catheter.  Objective:   VITALS:   Vitals:   04/15/17 1530 04/15/17 1540 04/15/17 1600 04/15/17 1630  BP: 125/81 111/70 118/78 (!) 113/56  Pulse: 73 66 70 74  Resp:  12 14 14   Temp:  (!) 96.9 F (36.1 C) 97.7 F (36.5 C) 97.7 F (36.5 C)  TempSrc:   Oral Oral  SpO2: 95% 99% 97% 99%  Weight:      Height:        PHYSICAL EXAM: Left upper extremity:polar care and sling in place.  Dressing c/d/i Neurovascular intact Sensation intact distally Intact pulses distally  LABS  No results found for this or any previous visit (from the past 24 hour(s)).  No results found.  Assessment/Plan: Day of Surgery   Active Problems:   S/P left rotator cuff repair  Patient will receive 24 hours of post-op antibiotics.  OT in AM.  Patient admitted for post-op pain control.  Patient asking for foley catheter which is reasonable given that he has a right AKA and wont be able to get to the bathroom easily.  He is wearing a sling on the left upper extremity which will make use of a urinal difficult.  Discontinue foley in AM and then plan for discharge tomorrow.    Thornton Park , MD 04/15/2017, 5:12 PM

## 2017-04-15 NOTE — Anesthesia Preprocedure Evaluation (Signed)
Anesthesia Evaluation  Patient identified by MRN, date of birth, ID band Patient awake    Reviewed: Allergy & Precautions, NPO status , Patient's Chart, lab work & pertinent test results  History of Anesthesia Complications (+) history of anesthetic complications (had MI during previous surgery)  Airway Mallampati: II  TM Distance: >3 FB Neck ROM: Full    Dental no notable dental hx.    Pulmonary neg pulmonary ROS, neg sleep apnea, neg COPD,    breath sounds clear to auscultation- rhonchi (-) wheezing      Cardiovascular hypertension, + CAD and + Past MI  (-) Cardiac Stents and (-) CABG  Rhythm:Regular Rate:Normal - Systolic murmurs and - Diastolic murmurs NM stress test 01/17/16: 1.Normal left ventricular function 2.Normal wall motion 3.Mild inferior scar without significant ischemia   Neuro/Psych PSYCHIATRIC DISORDERS Anxiety Depression negative neurological ROS     GI/Hepatic Neg liver ROS, GERD  ,  Endo/Other  negative endocrine ROSneg diabetes  Renal/GU negative Renal ROS     Musculoskeletal negative musculoskeletal ROS (+)   Abdominal (+) - obese,   Peds  Hematology negative hematology ROS (+)   Anesthesia Other Findings Past Medical History: No date: Abnormal EKG     Comment:  HX OF RIGHT BUNDLE BRANCH BLOCK 1967: Amputee, above knee, left ([place]) No date: Anxiety 6203 OR 5597: Complication of anesthesia     Comment:  MI AFTER HIP REPLACMENT SURGERY No date: Dementia     Comment:  mild No date: Depression No date: GERD (gastroesophageal reflux disease) 2003 OR 2004: Heart attack ([place])     Comment:  WITH LEFT HIP REPLACMENT SURGERY No date: Hemorrhoids     Comment:  HX OF No date: History of kidney stones     Comment:  X 34YRS AGO No date: HLD (hyperlipidemia) No date: Hypertension No date: Lumbar stenosis 2010: Lumbar stress fracture     Comment:  L2 TO L3  Sep 01, 1993: Prostate cancer  ([place]) 2003 OR 2004: Pulmonary embolus ([place])     Comment:  AFTER HIP REPLACEMENT No date: Renal lesion     Comment:  LEFT SEVERAL YRS AGO, STILL HAS: Rotator cuff tear     Comment:  RIGHT   Reproductive/Obstetrics                             Anesthesia Physical Anesthesia Plan  ASA: III  Anesthesia Plan: General   Post-op Pain Management:    Induction: Intravenous  PONV Risk Score and Plan: 1 and Ondansetron and Dexamethasone  Airway Management Planned: Oral ETT  Additional Equipment:   Intra-op Plan:   Post-operative Plan: Extubation in OR  Informed Consent: I have reviewed the patients History and Physical, chart, labs and discussed the procedure including the risks, benefits and alternatives for the proposed anesthesia with the patient or authorized representative who has indicated his/her understanding and acceptance.   Dental advisory given  Plan Discussed with: CRNA and Anesthesiologist  Anesthesia Plan Comments:         Anesthesia Quick Evaluation

## 2017-04-15 NOTE — Transfer of Care (Signed)
Immediate Anesthesia Transfer of Care Note  Patient: Stephen Fields  Procedure(s) Performed: Procedure(s): SHOULDER ARTHROSCOPY WITH MINI OPEN ROTATOR CUFF REPAIR, SUBACROMINAL DECOMPRESSION, DISTAL CLAVICLE EXCISION, BICEPS TENOTOMY, CHRONDROPLASTY HUMERAL HEAD . (Left)  Patient Location: PACU  Anesthesia Type:General  Level of Consciousness: sedated and responds to stimulation  Airway & Oxygen Therapy: Patient Spontanous Breathing and Patient connected to face mask oxygen  Post-op Assessment: Report given to RN and Post -op Vital signs reviewed and stable  Post vital signs: Reviewed and stable  Last Vitals:  Vitals:   04/15/17 0608 04/15/17 1206  BP: 126/64 105/60  Pulse: 72 76  Resp: 12 14  Temp: 36.5 C   SpO2: 97% 99%    Last Pain:  Vitals:   04/15/17 0608  TempSrc: Tympanic  PainSc: 4          Complications: No apparent anesthesia complications

## 2017-04-15 NOTE — Progress Notes (Signed)
Felt like he might void     No distention

## 2017-04-15 NOTE — Anesthesia Post-op Follow-up Note (Signed)
Anesthesia QCDR form completed.        

## 2017-04-15 NOTE — Anesthesia Postprocedure Evaluation (Signed)
Anesthesia Post Note  Patient: Stephen Fields  Procedure(s) Performed: Procedure(s) (LRB): SHOULDER ARTHROSCOPY WITH MINI OPEN ROTATOR CUFF REPAIR, SUBACROMINAL DECOMPRESSION, DISTAL CLAVICLE EXCISION, BICEPS TENOTOMY, CHRONDROPLASTY HUMERAL HEAD . (Left)  Patient location during evaluation: PACU Anesthesia Type: General Level of consciousness: awake and alert and oriented Pain management: pain level controlled Vital Signs Assessment: post-procedure vital signs reviewed and stable Respiratory status: spontaneous breathing, nonlabored ventilation and respiratory function stable Cardiovascular status: blood pressure returned to baseline and stable Postop Assessment: no signs of nausea or vomiting Anesthetic complications: no   Planning for admission for pain control   Last Vitals:  Vitals:   04/15/17 1509 04/15/17 1516  BP:  126/62  Pulse: 66 62  Resp: 13 15  Temp:    SpO2: 100% 99%    Last Pain:  Vitals:   04/15/17 1516  TempSrc:   PainSc: 8                  Adalyna Godbee

## 2017-04-15 NOTE — Anesthesia Procedure Notes (Signed)
Procedure Name: Intubation Performed by: Lance Muss Pre-anesthesia Checklist: Patient identified, Patient being monitored, Timeout performed, Emergency Drugs available and Suction available Patient Re-evaluated:Patient Re-evaluated prior to induction Oxygen Delivery Method: Circle system utilized Preoxygenation: Pre-oxygenation with 100% oxygen Induction Type: IV induction Ventilation: Mask ventilation without difficulty and Two handed mask ventilation required Laryngoscope Size: Mac and 3 Grade View: Grade I Tube type: Oral Tube size: 7.5 mm Number of attempts: 1 Airway Equipment and Method: Stylet Placement Confirmation: ETT inserted through vocal cords under direct vision,  positive ETCO2 and breath sounds checked- equal and bilateral Secured at: 23 cm Tube secured with: Tape Dental Injury: Teeth and Oropharynx as per pre-operative assessment

## 2017-04-15 NOTE — H&P (Signed)
The patient has been re-examined, and the chart reviewed, and there have been no interval changes to the documented history and physical.    The risks, benefits, and alternatives have been discussed at length, and the patient is willing to proceed.   

## 2017-04-16 ENCOUNTER — Observation Stay: Payer: Medicare Other

## 2017-04-16 ENCOUNTER — Encounter: Payer: Self-pay | Admitting: Orthopedic Surgery

## 2017-04-16 DIAGNOSIS — M75122 Complete rotator cuff tear or rupture of left shoulder, not specified as traumatic: Secondary | ICD-10-CM | POA: Diagnosis not present

## 2017-04-16 LAB — CBC
HCT: 38.2 % — ABNORMAL LOW (ref 40.0–52.0)
Hemoglobin: 12.8 g/dL — ABNORMAL LOW (ref 13.0–18.0)
MCH: 30.1 pg (ref 26.0–34.0)
MCHC: 33.5 g/dL (ref 32.0–36.0)
MCV: 89.7 fL (ref 80.0–100.0)
PLATELETS: 389 10*3/uL (ref 150–440)
RBC: 4.26 MIL/uL — AB (ref 4.40–5.90)
RDW: 13.4 % (ref 11.5–14.5)
WBC: 15.8 10*3/uL — AB (ref 3.8–10.6)

## 2017-04-16 LAB — BASIC METABOLIC PANEL
Anion gap: 8 (ref 5–15)
BUN: 10 mg/dL (ref 6–20)
CALCIUM: 8.8 mg/dL — AB (ref 8.9–10.3)
CO2: 31 mmol/L (ref 22–32)
CREATININE: 0.71 mg/dL (ref 0.61–1.24)
Chloride: 94 mmol/L — ABNORMAL LOW (ref 101–111)
Glucose, Bld: 170 mg/dL — ABNORMAL HIGH (ref 65–99)
Potassium: 3.9 mmol/L (ref 3.5–5.1)
SODIUM: 133 mmol/L — AB (ref 135–145)

## 2017-04-16 LAB — TROPONIN I

## 2017-04-16 LAB — GLUCOSE, CAPILLARY: GLUCOSE-CAPILLARY: 126 mg/dL — AB (ref 65–99)

## 2017-04-16 MED ORDER — HYDROCODONE-ACETAMINOPHEN 5-325 MG PO TABS
1.0000 | ORAL_TABLET | ORAL | Status: DC | PRN
  Administered 2017-04-17: 1 via ORAL
  Filled 2017-04-16: qty 1

## 2017-04-16 MED ORDER — PROMETHAZINE HCL 25 MG/ML IJ SOLN
12.5000 mg | Freq: Four times a day (QID) | INTRAMUSCULAR | Status: DC | PRN
  Administered 2017-04-16: 12.5 mg via INTRAVENOUS
  Filled 2017-04-16: qty 1

## 2017-04-16 MED ORDER — TRAMADOL HCL 50 MG PO TABS
50.0000 mg | ORAL_TABLET | Freq: Four times a day (QID) | ORAL | Status: DC | PRN
  Administered 2017-04-17: 50 mg via ORAL
  Filled 2017-04-16: qty 1

## 2017-04-16 MED ORDER — TRAMADOL HCL 50 MG PO TABS: 50.0000 mg | ORAL_TABLET | Freq: Four times a day (QID) | ORAL | Status: DC

## 2017-04-16 NOTE — Progress Notes (Signed)
  Subjective:  POD #1 s/p left shoulder rotator cuff repair.  Patient reports left shoulder pain as mild.  Patient is currently having nausea and is diaphoretic. He is drowsy but arousable. His family is at the bedside.  Objective:   VITALS:   Vitals:   04/16/17 0759 04/16/17 0801 04/16/17 1030 04/16/17 1313  BP: (!) 141/77  128/82 (!) 142/76  Pulse: 91 94  74  Resp: 20   20  Temp: 98.2 F (36.8 C)   99.2 F (37.3 C)  TempSrc: Oral   Oral  SpO2: 100% 95%  95%  Weight:      Height:        PHYSICAL EXAM:  Left upper extremity: Left upper extremity in sling.   Polar Care in place on left shoulder. Neurovascular intact Sensation intact distally Intact pulses distally  LABS  Results for orders placed or performed during the hospital encounter of 04/15/17 (from the past 24 hour(s))  MRSA PCR Screening     Status: None   Collection Time: 04/15/17  4:17 PM  Result Value Ref Range   MRSA by PCR NEGATIVE NEGATIVE  Glucose, capillary     Status: Abnormal   Collection Time: 04/16/17 10:59 AM  Result Value Ref Range   Glucose-Capillary 126 (H) 65 - 99 mg/dL    Dg Chest Port 1 View  Result Date: 04/16/2017 CLINICAL DATA:  Recent shoulder surgery with shortness of Breath EXAM: PORTABLE CHEST 1 VIEW COMPARISON:  01/24/2016 FINDINGS: Cardiac shadow is mildly enlarged but stable. Tortuosity of the thoracic aorta with mild calcifications are seen. Cardiac shadow is within normal limits. Mild elevation of the right hemidiaphragm seen. No focal infiltrate or sizable effusion is noted. No acute bony abnormality is seen. IMPRESSION: No acute abnormality noted. Aortic Atherosclerosis (ICD10-170.0) Electronically Signed   By: Inez Catalina M.D.   On: 04/16/2017 13:18    Assessment/Plan: 1 Day Post-Op   Active Problems:   S/P left rotator cuff repair  I'm ordering a CBC, BMP and troponins. Chest x-ray was negative for pathology.  Hospitalist has seen patient.  I have discontinued oxycodone  which I believe is making the patient drowsy and nauseated. He will receive Tylenol and tramadol for pain. Continue use of the left shoulder sling. I will reassess patient in the morning.    Thornton Park , MD 04/16/2017, 2:28 PM

## 2017-04-16 NOTE — Care Management Note (Addendum)
Case Management Note  Patient Details  Name: Stephen Fields MRN: [id number] Date of Birth: [birth year]  Subjective/Objective:  Patient from [facility]. Spoke with daughter. She is patient HCPOA. Wife is in the cancer center today getting chemo. Explained observation notice. She verbalized understanding. Patient is set up to see Dr. Mack Guise post op. He doesnt want patient to have any therapy on that arm per the daughter until he is seen at follow up appointment. Offered support to daughter. No further needs identified.                    Action/Plan: Home with self care and follow up with Dr. Cindi Carbon  Expected Discharge Date:  04/16/17               Expected Discharge Plan:  Home/Self Care  In-House Referral:     Discharge planning Services     Post Acute Care Choice:    Choice offered to:     DME Arranged:    DME Agency:     HH Arranged:    [place] Agency:     Status of Service:  Completed, signed off  If discussed at [employer] of Stay Meetings, dates discussed:    Additional Comments:  Jolly Mango, RN 04/16/2017, 11:00 AM
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
## 2017-04-16 NOTE — Care Management Obs Status (Signed)
[place] NOTIFICATION   Patient Details  Name: Stephen Fields MRN: [id number] Date of Birth: 02-23-1932   Medicare Observation Status Notification Given:  Yes    Jolly Mango, RN 04/16/2017, 11:09 AM

## 2017-04-16 NOTE — Progress Notes (Signed)
OT Cancellation Note  Patient Details Name: Stephen Fields MRN: [id number] DOB: 17-Apr-1932   Cancelled Treatment:     Attempted to see patient this am however he is nauseous and vomiting and not feeling well.  He requested for therapy to come back later in the day.  Will recheck status around lunch time to see if we can proceed with evaluation. Amy T Lovett, OTR/L, CLT   Lovett,Amy 04/16/2017, 10:20 AM

## 2017-04-16 NOTE — Progress Notes (Signed)
Patient nauseous and vomiting x2. zofran given and patient verbalizes some relief but continues to be nauseous. Patient was noted to be pale and clammy skin. BP checked 128/82. O2 Sat at 93% on 1 L of O2. Blood sugar 126mg /dL.   Dr Mack Guise made aware

## 2017-04-16 NOTE — Progress Notes (Signed)
Patient O2 sat 95% on 1 L of O2. HR 82. Pt's O2 Sat dropped to 90% on room air.  Patient was provided w the incentive spirometer and educated about it but he may need reinforcement.  Pt back to 1 L of O2.    Wynema Birch, RN

## 2017-04-17 DIAGNOSIS — M75122 Complete rotator cuff tear or rupture of left shoulder, not specified as traumatic: Secondary | ICD-10-CM | POA: Diagnosis not present

## 2017-04-17 MED ORDER — ONDANSETRON HCL 4 MG PO TABS: 4.0000 mg | ORAL_TABLET | Freq: Three times a day (TID) | ORAL | Status: DC | PRN

## 2017-04-17 MED ORDER — TRAMADOL HCL 50 MG PO TABS: 50.0000 mg | ORAL_TABLET | Freq: Four times a day (QID) | ORAL | 0 refills | Status: AC | PRN

## 2017-04-17 MED ORDER — ONDANSETRON HCL 4 MG PO TABS: 4.0000 mg | ORAL_TABLET | Freq: Three times a day (TID) | ORAL | 0 refills | Status: AC | PRN

## 2017-04-17 NOTE — Care Management Note (Signed)
Case Management Note  Patient Details  Name: Stephen Fields MRN: [id number] Date of Birth: May 07, 1932  Subjective/Objective:     A referral for HH-PT, RN was called to Stephen Fields at  [facility] per family request.  Dr Sabra Heck is on the unit, has spoken with family and has agreed to put in home health orders.              Action/Plan:   Expected Discharge Date:  04/17/17               Expected Discharge Plan:  Home/Self Care  In-House Referral:     Discharge planning Services     Post Acute Care Choice:    Choice offered to:     DME Arranged:    DME Agency:     HH Arranged:  RN, PT HH Agency:  [facility]  Status of Service:  Completed, signed off  If discussed at [place] of Stay Meetings, dates discussed:    Additional Comments:  Lyrah Bradt A, RN 04/17/2017, 3:01 PM

## 2017-04-17 NOTE — Clinical Social Work Note (Addendum)
CSW met with the patient's family at bedside to update that [facility] is in agreement with the patient discharging today. Once the [facility] 2 is signed and the hard script for Zofran/Ondansetron is available, the packet will be delivered to the chart.  CSW has sent FL 2 to the facility and delivered packet. The CSW also met again with the patient's family to thank them for their patience. CSW will continue to follow pending additional discharge needs.  Santiago Bumpers, MSW, Latanya Presser [phone]

## 2017-04-17 NOTE — Clinical Social Work Note (Signed)
CSW attempted to meet with the patient's POA at bedside with no success. The CSW attempted to contact the patient's POA by phone and was informed that the POA is on the way to [facility]. CSW will stand by for the patient's POA to discuss discharge plans to [facility].  Santiago Bumpers, MSW, Latanya Presser [phone]

## 2017-04-17 NOTE — Evaluation (Signed)
Occupational Therapy Evaluation Patient Details Name: Stephen Fields MRN: [id number] DOB: 1931-10-26 Today's Date: 04/17/2017    History of Present Illness Patient is a 83 y.o. male s/p left rotator cuff repair   Clinical Impression   Patient seen for OT evaluation this date as follows: Daughters arrived for second half of evaluation. Patient currently lives at [facility] after fall 3-4 months ago when injured shoulder, and had difficulty with prosthetic. Prior to fall, patient lived with wife in town home that is 1 story, has 1 step to enter, and is accessible inside. Patient was previously independent with all self care tasks prior to fall. He states he has had difficulty with feeding and dressing due to limited use of LUE, which has gotten worse over the past few months leading up to surgery for rotator cuff tear. Patient presents with generalized weakness, decreased transfers, mobility, decreased safety, and decreased ability to perform self care tasks. Patient would benefit from skilled OT to maximize safety and independence in necessary daily tasks. Recommend follow up therapy at discharge.     Follow Up Recommendations  DC plan and follow up therapy as arranged by surgeon    Equipment Recommendations       Recommendations for Other Services       Precautions / Restrictions Precautions Precautions: Fall;Shoulder Shoulder Interventions: Shoulder sling/immobilizer;Shoulder abduction pillow;At all times Restrictions Weight Bearing Restrictions: Yes LUE Weight Bearing: Non weight bearing      Mobility Bed Mobility Overal bed mobility: Needs Assistance Bed Mobility: Supine to Sit     Supine to sit: Min assist;Mod assist     General bed mobility comments:  (Verbal cues and assist for managing LUE precautions. Also limited by nausea.)  Transfers                      Balance                                           ADL either performed  or assessed with clinical judgement   ADL Overall ADL's : Needs assistance/impaired Eating/Feeding: Minimal assistance;Set up   Grooming: Set up;Minimal assistance   Upper Body Bathing: Moderate assistance   Lower Body Bathing: Moderate assistance;Maximal assistance   Upper Body Dressing : Maximal assistance   Lower Body Dressing: Moderate assistance;Maximal assistance   Toilet Transfer: Moderate assistance;BSC   Toileting- Clothing Manipulation and Hygiene: Moderate assistance;Maximal assistance         General ADL Comments: Patient is unable to use LUE to provide support/balance or perform ADL tasks. Patient has AKA and typically wears a prosthetic (not currently wearing). Requires cuing and assistance for managing tasks at this time.     Vision Patient Visual Report: No change from baseline       Perception     Praxis      Pertinent Vitals/Pain Pain Assessment: 0-10 Pain Score: 6  Pain Descriptors / Indicators: Discomfort Pain Intervention(s): Limited activity within patient's tolerance;Monitored during session     Hand Dominance Right   Extremity/Trunk Assessment Upper Extremity Assessment Upper Extremity Assessment: LUE deficits/detail LUE: Unable to fully assess due to immobilization   Lower Extremity Assessment Lower Extremity Assessment: Overall WFL for tasks assessed (Patient has AKA, and typically wears a prosthetic)       Communication Communication Communication: No difficulties   Cognition Arousal/Alertness: Awake/alert Behavior During Therapy:  WFL for tasks assessed/performed Overall Cognitive Status: Within Functional Limits for tasks assessed                                     General Comments       Exercises     Shoulder Instructions      Home Living Family/patient expects to be discharged to:: Assisted living                                        Prior Functioning/Environment Level of  Independence: Needs assistance  Gait / Transfers Assistance Needed: assistance over past 3-4 months  ADL's / Homemaking Assistance Needed: assist with dressing   Comments:  (Patient has been living at ALF since initial fall 3-4 months ago. Was independent prior to that time, and was living at home with wife.)        OT Problem List: Decreased range of motion;Decreased safety awareness;Impaired UE functional use;Pain      OT Treatment/Interventions: Self-care/ADL training;Therapeutic exercise;DME and/or AE instruction;Patient/family education;Therapeutic activities    OT Goals(Current goals can be found in the care plan section) Acute Rehab OT Goals Patient Stated Goal: "get shoulder healed and get home" OT Goal Formulation: With patient Time For Goal Achievement: 04/24/17 Potential to Achieve Goals: Good  OT Frequency: Min 2X/week   Barriers to D/C: Decreased caregiver support   (Wife is undergoing cancer treatment and unable to provide physical assist.)       Co-evaluation              AM-PAC PT "6 Clicks" Daily Activity     Outcome Measure Help from another person eating meals?: A Little Help from another person taking care of personal grooming?: A Little Help from another person toileting, which includes using toliet, bedpan, or urinal?: A Lot Help from another person bathing (including washing, rinsing, drying)?: A Lot Help from another person to put on and taking off regular upper body clothing?: A Lot Help from another person to put on and taking off regular lower body clothing?: A Lot 6 Click Score: 14   End of Session Equipment Utilized During Treatment: Other (comment) (LUE abduction sling)  Activity Tolerance: Other (comment) (patient limited by nausea) Patient left: in bed;with call bell/phone within reach;with bed alarm set;with family/visitor present  OT Visit Diagnosis: Muscle weakness (generalized) (M62.81);History of falling (Z91.81);Pain;Feeding  difficulties (R63.3) Pain - Right/Left: Left Pain - part of body: Shoulder                Time: 8413-2440 OT Time Calculation (min): 30 min Charges:  OT General Charges $OT Visit: 1 Procedure OT Evaluation $OT Eval Low Complexity: 1 Procedure OT Treatments $Self Care/Home Management : 8-22 mins G-Codes: OT G-codes **NOT FOR INPATIENT CLASS** Functional Assessment Tool Used: AM-PAC 6 Clicks Daily Activity;Clinical judgement Functional Limitation: Self care Self Care Current Status (N0272): At least 40 percent but less than 60 percent impaired, limited or restricted Self Care Goal Status (Z3664): At least 40 percent but less than 60 percent impaired, limited or restricted Self Care Discharge Status [phone]): At least 40 percent but less than 60 percent impaired, limited or restricted   Amie Portland, OTR/L  Haleemah Buckalew L 04/17/2017, 2:46 PM

## 2017-04-17 NOTE — Progress Notes (Signed)
Subjective:  Stability #2 status post left shoulder mini open rotator cuff repair. Patient reports left shoulder pain as mild to moderate.  Patient states the current pain medications are working for him.  Patient states that his nausea and vomiting has resolved.  Objective:   VITALS:   Vitals:   04/16/17 1313 04/16/17 1501 04/16/17 2037 04/17/17 0728  BP: (!) 142/76 (!) 142/71 137/75 138/61  Pulse: 74 92 96 82  Resp: 20 16 19 18   Temp: 99.2 F (37.3 C) 98.1 F (36.7 C) 99.7 F (37.6 C) 98.9 F (37.2 C)  TempSrc: Oral Oral Oral Oral  SpO2: 95% 95% 96% 100%  Weight:      Height:        PHYSICAL EXAM: Left upper extremity: Patient can flex and extend all 5 [place] left hand. Has palpable radial pulse. His dressing remained clean and dry. Polar Care in abduction sling are in place on the left upper extremity. Neurovascular intact Sensation intact distally Intact pulses distally Incision: dressing C/D/I  LABS  Results for orders placed or performed during the hospital encounter of 04/15/17 (from the past 24 hour(s))  Glucose, capillary     Status: Abnormal   Collection Time: 04/16/17 10:59 AM  Result Value Ref Range   Glucose-Capillary 126 (H) 65 - 99 mg/dL  Basic metabolic panel     Status: Abnormal   Collection Time: 04/16/17  2:41 PM  Result Value Ref Range   Sodium 133 (L) 135 - 145 mmol/L   Potassium 3.9 3.5 - 5.1 mmol/L   Chloride 94 (L) 101 - 111 mmol/L   CO2 31 22 - 32 mmol/L   Glucose, Bld 170 (H) 65 - 99 mg/dL   BUN 10 6 - 20 mg/dL   Creatinine, Ser 0.71 0.61 - 1.24 mg/dL   Calcium 8.8 (L) 8.9 - 10.3 mg/dL   GFR calc non Af Amer >60 >60 mL/min   GFR calc Af Amer >60 >60 mL/min   Anion gap 8 5 - 15  CBC     Status: Abnormal   Collection Time: 04/16/17  2:41 PM  Result Value Ref Range   WBC 15.8 (H) 3.8 - 10.6 K/uL   RBC 4.26 (L) 4.40 - 5.90 MIL/uL   Hemoglobin 12.8 (L) 13.0 - 18.0 g/dL   HCT 38.2 (L) 40.0 - 52.0 %   MCV 89.7 80.0 - 100.0 fL   MCH  30.1 26.0 - 34.0 pg   MCHC 33.5 32.0 - 36.0 g/dL   RDW 13.4 11.5 - 14.5 %   Platelets 389 150 - 440 K/uL  Troponin I (q 6hr x 3)     Status: None   Collection Time: 04/16/17  2:41 PM  Result Value Ref Range   Troponin I <0.03 <0.03 ng/mL    Dg Chest Port 1 View  Result Date: 04/16/2017 CLINICAL DATA:  Recent shoulder surgery with shortness of Breath EXAM: PORTABLE CHEST 1 VIEW COMPARISON:  01/24/2016 FINDINGS: Cardiac shadow is mildly enlarged but stable. Tortuosity of the thoracic aorta with mild calcifications are seen. Cardiac shadow is within normal limits. Mild elevation of the right hemidiaphragm seen. No focal infiltrate or sizable effusion is noted. No acute bony abnormality is seen. IMPRESSION: No acute abnormality noted. Aortic Atherosclerosis (ICD10-170.0) Electronically Signed   By: Inez Catalina M.D.   On: 04/16/2017 13:18    Assessment/Plan: 2 Days Post-Op   Active Problems:   S/P left rotator cuff repair  Patient's workup yesterday showed labs within expected values.  His elevated white count is likely postsurgical. He remains afebrile with stable vital signs. Patient's pain is controlled. His nausea has resolved. He may go home today and follow up with me in 1 week in the office.    Thornton Park , MD 04/17/2017, 7:35 AM

## 2017-04-17 NOTE — Progress Notes (Signed)
Consulted for hypoxia.hypoxia resolved,continue IS as needed.stable and being discharged today.

## 2017-04-17 NOTE — Progress Notes (Signed)
EMS arrived to transport pt to [facility]. Belongings sent with pt.  Bethann Punches, RN

## 2017-04-17 NOTE — Progress Notes (Signed)
RNCM and CSW have arranged for discharge back to [facility] facility, family aware and in agreement. Hard scripts for tramadol and zofran given to daughter to fill and take to [facility]. PIV removed. VSS. EMS called for transport.   Bethann Punches, RN

## 2017-04-17 NOTE — Discharge Summary (Signed)
Physician Discharge Summary  Patient ID: Stephen Fields MRN: [id number] DOB/AGE: [birth year] 83 y.o.  Admit date: 04/15/2017 Discharge date: 04/17/2017  Admission Diagnoses:  M75.122 Complete rotatr-cuff tear/ruptr of left shoulder, not trauma <principal problem not specified>  Discharge Diagnoses:  M75.122 Complete rotatr-cuff tear/ruptr of left shoulder, not trauma Active Problems:   S/P left rotator cuff repair   Past Medical History:  Diagnosis Date  . Abnormal EKG    HX OF RIGHT BUNDLE BRANCH BLOCK  . Amputee, above knee, left ([place]) 1967  . Anxiety   . Complication of anesthesia 2003 OR 2004   MI AFTER HIP REPLACMENT SURGERY  . Dementia    mild  . Depression   . GERD (gastroesophageal reflux disease)   . Heart attack ([place]) 2003 OR 2004   WITH LEFT HIP REPLACMENT SURGERY  . Hemorrhoids    HX OF  . History of kidney stones    X 39YRS AGO  . HLD (hyperlipidemia)   . Hypertension   . Lumbar stenosis   . Lumbar stress fracture 2010   L2 TO L3   . Prostate cancer ([place]) Sep 01, 1993  . Pulmonary embolus ([place]) 2003 OR 2004   AFTER HIP REPLACEMENT  . Renal lesion    LEFT  . Rotator cuff tear SEVERAL YRS AGO, STILL HAS   RIGHT    Surgeries: Procedure(s): SHOULDER ARTHROSCOPY WITH MINI OPEN ROTATOR CUFF REPAIR, SUBACROMINAL DECOMPRESSION, DISTAL CLAVICLE EXCISION, BICEPS TENOTOMY, CHRONDROPLASTY HUMERAL HEAD . on 04/15/2017   Consultants (if any):   Discharged Condition: Improved  Hospital Course: Stephen Fields is an 83 y.o. male who was admitted 04/15/2017 with a diagnosis of  M75.122 Complete rotatr-cuff tear/ruptr of left shoulder, not trauma <principal problem not specified> and went to the operating room on 04/15/2017 and underwent an uncomplicated repair. On the left rotator cuff. Patient had postoperative pain and was admitted to the orthopedic floor for postoperative pain management. On postoperative day #1 he was found to have nausea and vomiting and an increasing  supplemental O2 requirement. He was taken off oxycodone and started on tramadol. Blood work was performed including troponin level which were within expected limits. Chest x-ray was negative for pathology. Patient was clinically improved on postop day #2. He was not requiring supplemental O2. His nausea and vomiting had resolved. Patient will be discharged home and follow up with me in 1 week.  He was given perioperative antibiotics:  Anti-infectives    Start     Dose/Rate Route Frequency Ordered Stop   04/15/17 1630  ceFAZolin (ANCEF) IVPB 1 g/50 mL premix     1 g 100 mL/hr over 30 Minutes Intravenous Every 6 hours 04/15/17 1620 04/16/17 0551   04/15/17 0600  ceFAZolin (ANCEF) IVPB 2g/100 mL premix     2 g 200 mL/hr over 30 Minutes Intravenous On call to O.R. 04/14/17 2134 04/15/17 0830    .  He was given sequential compression devices, early ambulation, and  for DVT prophylaxis.  He benefited maximally from the hospital stay and there were no complications.    Recent vital signs:  Vitals:   04/16/17 2037 04/17/17 0728  BP: 137/75 138/61  Pulse: 96 82  Resp: 19 18  Temp: 99.7 F (37.6 C) 98.9 F (37.2 C)  SpO2: 96% 100%    Recent laboratory studies:  Lab Results  Component Value Date   HGB 12.8 (L) 04/16/2017   HGB 13.6 04/08/2017   HGB 12.0 (L) 01/25/2016   Lab Results  Component Value Date   WBC 15.8 (H) 04/16/2017   PLT 389 04/16/2017   Lab Results  Component Value Date   INR 0.99 04/08/2017   Lab Results  Component Value Date   NA 133 (L) 04/16/2017   K 3.9 04/16/2017   CL 94 (L) 04/16/2017   CO2 31 04/16/2017   BUN 10 04/16/2017   CREATININE 0.71 04/16/2017   GLUCOSE 170 (H) 04/16/2017    Discharge Medications:   Allergies as of 04/17/2017   No Known Allergies     Medication List    STOP taking these medications   naproxen sodium 220 MG tablet Commonly known as:  ANAPROX     TAKE these medications   acetaminophen 325 MG tablet Commonly known  as:  TYLENOL Take 2 tablets (650 mg total) by mouth every 4 (four) hours as needed. What changed:  when to take this  reasons to take this   amLODipine 5 MG tablet Commonly known as:  NORVASC Take 5 mg by mouth daily. (0800)   donepezil 5 MG tablet Commonly known as:  ARICEPT Take 5 mg by mouth at bedtime. (2000)   ibuprofen 200 MG tablet Commonly known as:  MOTRIN IB Take 1 tablet (200 mg total) by mouth every 4 (four) hours as needed for mild pain or moderate pain.   lisinopril-hydrochlorothiazide 20-12.5 MG tablet Commonly known as:  PRINZIDE,ZESTORETIC Take 1 tablet by mouth daily. (0800)   loperamide 2 MG capsule Commonly known as:  IMODIUM Take 2 mg by mouth 4 (four) times daily as needed for diarrhea or loose stools.   metoprolol succinate 50 MG 24 hr tablet Commonly known as:  TOPROL-XL Take 50 mg by mouth daily. (0800)   multivitamin with minerals tablet Take 1 tablet by mouth daily. (0800)   omeprazole 20 MG capsule Commonly known as:  PRILOSEC Take 20 mg by mouth daily. (0800)   ondansetron 8 MG tablet Commonly known as:  ZOFRAN Take 8 mg by mouth every 8 (eight) hours as needed for nausea or vomiting.   sertraline 50 MG tablet Commonly known as:  ZOLOFT Take 50 mg by mouth daily. (0800)   traMADol 50 MG tablet Commonly known as:  ULTRAM Take 1 tablet (50 mg total) by mouth every 6 (six) hours as needed for moderate pain.       Diagnostic Studies: Dg Chest Port 1 View  Result Date: 04/16/2017 CLINICAL DATA:  Recent shoulder surgery with shortness of Breath EXAM: PORTABLE CHEST 1 VIEW COMPARISON:  01/24/2016 FINDINGS: Cardiac shadow is mildly enlarged but stable. Tortuosity of the thoracic aorta with mild calcifications are seen. Cardiac shadow is within normal limits. Mild elevation of the right hemidiaphragm seen. No focal infiltrate or sizable effusion is noted. No acute bony abnormality is seen. IMPRESSION: No acute abnormality noted. Aortic  Atherosclerosis (ICD10-170.0) Electronically Signed   By: Inez Catalina M.D.   On: 04/16/2017 13:18    Disposition: 01-Home or Self Care  Discharge Instructions    Call MD / Call 911    Complete by:  As directed    If you experience chest pain or shortness of breath, CALL 911 and be transported to the hospital emergency room.  If you develope a fever above 101 F, pus (white drainage) or increased drainage or redness at the wound, or calf pain, call your surgeon's office.   Call MD / Call 911    Complete by:  As directed    If you experience chest pain or  shortness of breath, CALL 911 and be transported to the hospital emergency room.  If you develope a fever above 101 F, pus (white drainage) or increased drainage or redness at the wound, or calf pain, call your surgeon's office.   Constipation Prevention    Complete by:  As directed    Drink plenty of fluids.  Prune juice may be helpful.  You may use a stool softener, such as Colace (over the counter) 100 mg twice a day.  Use MiraLax (over the counter) for constipation as needed.   Constipation Prevention    Complete by:  As directed    Drink plenty of fluids.  Prune juice may be helpful.  You may use a stool softener, such as Colace (over the counter) 100 mg twice a day.  Use MiraLax (over the counter) for constipation as needed.   Diet general    Complete by:  As directed    Diet general    Complete by:  As directed    Discharge instructions    Complete by:  As directed    Wear sling at all times, including sleep.  You will need to use the sling for a total of 4 weeks following surgery.  Do not try and lift your arm up or away from your body for any reason.   Keep the dressing dry.  You may remove bandage in 3 days.  Leave the Steri-Strips (white medical tape) in place.  You may place additional Band-Aids over top of the Steri-Strips if you wish.  May shower once dressing is removed in 3 days.  Remove sling carefully only for showers,  leaving arm down by your side while in the shower.  Patient may alternate oxycodone with ibuprofen 200mg  PO Q4 hours prn pain and tylenol 650mg  PO Q 4 hours prn pain.  Have patient take oxycodone.  If he continues to have pain, he may take tylenol within an hour and then ibuprofen after another hour if pain persists.     You may be most comfortable sleeping in a recliner.  If you do sleep in near bed, placed pillows behind the shoulder that have the operation to support it.   Discharge instructions    Complete by:  As directed    Wear sling at all times, including sleep.  You will need to use the sling for a total of 4 weeks following surgery.  Do not try and lift your arm up or away from your body for any reason.  You may use the cold pad (polar care) as much is you like to help reduce swelling, but remove it if it feels too cold.  Keep the dressing dry.  You may remove bandage in 3 days.  Leave the Steri-Strips (white medical tape) in place.  You may place additional Band-Aids over top of the Steri-Strips if you wish.  May shower once dressing is removed in 3 days.  Remove sling carefully only for showers, leaving arm down by your side while in the shower.  You may be most comfortable sleeping in a recliner.  If you do sleep in near bed, placed pillows behind the shoulder that have the operation to support it.   Driving restrictions    Complete by:  As directed    No driving for 4 weeks   Driving restrictions    Complete by:  As directed    No driving for minimum of 4 weeks   Increase activity slowly as tolerated  Complete by:  As directed    Increase activity slowly as tolerated    Complete by:  As directed    Lifting restrictions    Complete by:  As directed    No lifting for 16 weeks      Follow-up Information    Thornton Park, MD Follow up.   Specialty:  Orthopedic Surgery Why:  as scheduled Contact information: [place]  [id number] [phone]            Signed: Thornton Park ,MD 04/17/2017, 7:48 AM

## 2017-04-17 NOTE — NC FL2 (Signed)
[place] LEVEL OF CARE SCREENING TOOL     IDENTIFICATION  Patient Name: Stephen Fields Birthdate: 1931-11-10 Sex: male Admission Date (Current Location): 04/15/2017  [place] and [place] Number:  Engineering geologist and Address:  [facility], [address] [place], [place] [address]      Provider Number: [id number]  Attending Physician Name and Address:  Thornton Park, MD  Relative Name and Phone Number:       Current Level of Care: Hospital Recommended Level of Care: [place] Prior Approval Number:    Date Approved/Denied:   PASRR Number: [id number]  Discharge Plan: Domiciliary (Rest home)    Current Diagnoses: Patient Active Problem List   Diagnosis Date Noted  . S/P left rotator cuff repair 04/15/2017  . Renal mass, left 01/24/2016  . Encounter for preprocedural cardiovascular examination 01/20/2016  . 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 H/O malignant neoplasm of prostate 09/25/2014  . Lumbar canal stenosis 04/05/2014  . Acid reflux 12/27/2013  . BP (high blood pressure) 12/27/2013  . Compression fracture of vertebral column ([place]) 12/27/2013    Orientation RESPIRATION BLADDER Height & Weight     Self, Time, Situation  Normal Continent Weight: 170 lb (77.1 kg) Height:  5\' 8"  (172.7 cm)  BEHAVIORAL SYMPTOMS/MOOD NEUROLOGICAL BOWEL NUTRITION STATUS      Continent    AMBULATORY STATUS COMMUNICATION OF NEEDS Skin   Supervision Verbally Surgical wounds                       Personal Care Assistance Level of Assistance  Bathing, Feeding, Dressing Bathing Assistance: Maximum assistance Feeding assistance: Limited assistance Dressing Assistance: Maximum assistance     Functional Limitations Info             SPECIAL CARE FACTORS FREQUENCY                       Contractures Contractures Info: Not present    Additional Factors Info  Code  Status, Allergies Code Status Info: Full Allergies Info: No known allergies           Current Medications (04/17/2017):  This is the current hospital active medication list Current Facility-Administered Medications  Medication Dose Route Frequency Provider Last Rate Last Dose  . acetaminophen (TYLENOL) tablet 650 mg  650 mg Oral Q6H PRN Thornton Park, MD   650 mg at 04/17/17 0867   Or  . acetaminophen (TYLENOL) suppository 650 mg  650 mg Rectal Q6H PRN Thornton Park, MD      . alum & mag hydroxide-simeth (MAALOX/MYLANTA) 200-200-20 MG/5ML suspension 30 mL  30 mL Oral Q4H PRN Thornton Park, MD      . amLODipine (NORVASC) tablet 5 mg  5 mg Oral Daily Thornton Park, MD   5 mg at 04/17/17 0912  . bisacodyl (DULCOLAX) suppository 10 mg  10 mg Rectal Daily PRN Thornton Park, MD      . docusate sodium (COLACE) capsule 100 mg  100 mg Oral BID Thornton Park, MD   100 mg at 04/17/17 0912  . donepezil (ARICEPT) tablet 5 mg  5 mg Oral QHS Thornton Park, MD   5 mg at 04/16/17 2126  . lisinopril (PRINIVIL,ZESTRIL) tablet 20 mg  20 mg Oral Daily Thornton Park, MD   20 mg at 04/17/17 0912   And  . hydrochlorothiazide (MICROZIDE) capsule 12.5 mg  12.5 mg Oral Daily Krasinski,  Lennette Bihari, MD   12.5 mg at 04/17/17 0912  . HYDROcodone-acetaminophen (NORCO/VICODIN) 5-325 MG per tablet 1 tablet  1 tablet Oral Q4H PRN Thornton Park, MD      . HYDROmorphone (DILAUDID) injection 0.5 mg  0.5 mg Intravenous Q2H PRN Thornton Park, MD      . loperamide (IMODIUM) capsule 2 mg  2 mg Oral QID PRN Thornton Park, MD      . magnesium citrate solution 1 Bottle  1 Bottle Oral Once PRN Thornton Park, MD      . menthol-cetylpyridinium (CEPACOL) lozenge 3 mg  1 lozenge Oral PRN Thornton Park, MD       Or  . phenol (CHLORASEPTIC) mouth spray 1 spray  1 spray Mouth/Throat PRN Thornton Park, MD      . methocarbamol (ROBAXIN) tablet 500 mg  500 mg Oral Q6H PRN Thornton Park, MD       Or  .  methocarbamol (ROBAXIN) 500 mg in dextrose 5 % 50 mL IVPB  500 mg Intravenous Q6H PRN Thornton Park, MD      . metoprolol succinate (TOPROL-XL) 24 hr tablet 50 mg  50 mg Oral Daily Thornton Park, MD   50 mg at 04/17/17 8119  . multivitamin with minerals tablet 1 tablet  1 tablet Oral Daily Thornton Park, MD   1 tablet at 04/17/17 0911  . ondansetron (ZOFRAN) tablet 4 mg  4 mg Oral Q6H PRN Thornton Park, MD       Or  . ondansetron [facility]) injection 4 mg  4 mg Intravenous Q6H PRN Thornton Park, MD   4 mg at 04/16/17 0954  . pantoprazole (PROTONIX) EC tablet 40 mg  40 mg Oral Daily Thornton Park, MD   40 mg at 04/17/17 0911  . polyethylene glycol (MIRALAX / GLYCOLAX) packet 17 g  17 g Oral Daily PRN Thornton Park, MD      . promethazine (PHENERGAN) injection 12.5 mg  12.5 mg Intravenous Q6H PRN Thornton Park, MD   12.5 mg at 04/16/17 1531  . senna (SENOKOT) tablet 8.6 mg  1 tablet Oral BID Thornton Park, MD   8.6 mg at 04/17/17 0911  . sertraline (ZOLOFT) tablet 50 mg  50 mg Oral Daily Thornton Park, MD   50 mg at 04/17/17 0912  . traMADol (ULTRAM) tablet 50 mg  50 mg Oral Q6H PRN Thornton Park, MD   50 mg at 04/17/17 0912     Discharge Medications: Medication List     STOP taking these medications   naproxen sodium 220 MG tablet Commonly known as:  ANAPROX     TAKE these medications   acetaminophen 325 MG tablet Commonly known as:  TYLENOL Take 2 tablets (650 mg total) by mouth every 4 (four) hours as needed. What changed:  when to take this  reasons to take this   amLODipine 5 MG tablet Commonly known as:  NORVASC Take 5 mg by mouth daily. (0800)   donepezil 5 MG tablet Commonly known as:  ARICEPT Take 5 mg by mouth at bedtime. (2000)   ibuprofen 200 MG tablet Commonly known as:  MOTRIN IB Take 1 tablet (200 mg total) by mouth every 4 (four) hours as needed for mild pain or moderate pain.   lisinopril-hydrochlorothiazide 20-12.5 MG  tablet Commonly known as:  PRINZIDE,ZESTORETIC Take 1 tablet by mouth daily. (0800)   loperamide 2 MG capsule Commonly known as:  IMODIUM Take 2 mg by mouth 4 (four) times daily as needed for diarrhea or loose  stools.   metoprolol succinate 50 MG 24 hr tablet Commonly known as:  TOPROL-XL Take 50 mg by mouth daily. (0800)   multivitamin with minerals tablet Take 1 tablet by mouth daily. (0800)   omeprazole 20 MG capsule Commonly known as:  PRILOSEC Take 20 mg by mouth daily. (0800)   ondansetron 8 MG tablet Commonly known as:  ZOFRAN Take 8 mg by mouth every 8 (eight) hours as needed for nausea or vomiting.   sertraline 50 MG tablet Commonly known as:  ZOLOFT Take 50 mg by mouth daily. (0800)   traMADol 50 MG tablet Commonly known as:  ULTRAM Take 1 tablet (50 mg total) by mouth every 6 (six) hours as needed for moderate pain.    Relevant Imaging Results:  Relevant Lab Results:   Additional Information [id number]  Zettie Pho, LCSW

## 2017-04-17 NOTE — Clinical Social Work Note (Signed)
Clinical Social Work Assessment  Patient Details  Name: Stephen Fields MRN: [id number] Date of Birth: May 10, 1932  Date of referral:  04/17/17               Reason for consult:  Facility Placement                Permission sought to share information with:  Chartered certified accountant granted to share information::  Yes, Verbal Permission Granted  Name::        Agency::  [facility]  Relationship::     Contact Information:     Housing/Transportation Living arrangements for the past 2 months:  [place] of Information:  Patient, Medical Team, Adult Children Patient Interpreter Needed:  None Criminal Activity/Legal Involvement Pertinent to Current Situation/Hospitalization:  No - Comment as needed Significant Relationships:  Adult Children, Community Support Lives with:  Facility Resident Do you feel safe going back to the place where you live?  Yes Need for family participation in patient care:  No (Coment) (Patient's HCPOA is Marin Roberts (stepdaughter))  Care giving concerns:  Patient admitted from [facility].   Social Worker assessment / plan:  CSW met with the patient, his daughter Jenny Reichmann, and his stepdaughter/HCPOA Ivin Booty to discuss discharge planning. The patient is a resident of [facility], level 3, and is expected to discharge to the ALF today. The family would like home health nursing to be ordered if deemed necessary. The patient has a credit at [facility]; however, that SNF does not accept new intakes on the weekend unless the process is begun during the business week. As the family gave this information to his nurse after business hours on Friday, the process could not be started.  The family is willing to have the patient return to the ALF; however, they are concerned that he will not get the level of care he needs over the weekend due to limited staffing. The CSW advised that if something were to  happen, the ALF would return the patient to the hospital for safety. CSW will facilitate discharge to the ALF today with non-emergent EMS.   Employment status:  Retired Forensic scientist:  Medicare PT Recommendations:  Not assessed at this time Information / Referral to community resources:     Patient/Family's Response to care: The family is fully involved and thanked the CSW.  Patient/Family's Understanding of and Emotional Response to Diagnosis, Current Treatment, and Prognosis:  The patient and his family understand the barriers for weekend discharge and are in agreement with the plan to discharge to the ALF.  Emotional Assessment Appearance:  Appears stated age Attitude/Demeanor/Rapport:   (Pleasant) Affect (typically observed):  Appropriate, Pleasant Orientation:  Oriented to Self, Oriented to Place, Oriented to  Time, Oriented to Situation Alcohol / Substance use:  Never Used Psych involvement (Current and /or in the community):  No (Comment)  Discharge Needs  Concerns to be addressed:  Care Coordination, Discharge Planning Concerns Readmission within the last 30 days:  No Current discharge risk:  Physical Impairment Barriers to Discharge:  No Barriers Identified   Zettie Pho, LCSW 04/17/2017, 10:02 AM

## 2017-04-21 ENCOUNTER — Emergency Department: Payer: Medicare Other

## 2017-04-21 ENCOUNTER — Encounter: Payer: Self-pay | Admitting: Emergency Medicine

## 2017-04-21 ENCOUNTER — Emergency Department
Admission: EM | Admit: 2017-04-21 | Discharge: 2017-04-22 | Disposition: A | Discharge status: Home / Self Care | Payer: Medicare Other | Attending: Emergency Medicine | Admitting: Emergency Medicine

## 2017-04-21 DIAGNOSIS — I10 Essential (primary) hypertension: Secondary | ICD-10-CM | POA: Diagnosis not present

## 2017-04-21 DIAGNOSIS — Z79899 Other long term (current) drug therapy: Secondary | ICD-10-CM | POA: Diagnosis not present

## 2017-04-21 DIAGNOSIS — K5900 Constipation, unspecified: Secondary | ICD-10-CM | POA: Insufficient documentation

## 2017-04-21 MED ORDER — MAGNESIUM CITRATE PO SOLN: 1.0000 | Freq: Once | ORAL | 0 refills | Status: AC

## 2017-04-21 MED ORDER — SODIUM CHLORIDE 0.9 % IV BOLUS (SEPSIS)
1000.0000 mL | Freq: Once | INTRAVENOUS | Status: AC
  Administered 2017-04-21: 1000 mL via INTRAVENOUS

## 2017-04-21 MED ORDER — MAGNESIUM CITRATE PO SOLN
1.0000 | Freq: Once | ORAL | Status: AC
  Administered 2017-04-21: 1 via ORAL
  Filled 2017-04-21: qty 296

## 2017-04-21 MED ORDER — DOCUSATE SODIUM 100 MG PO CAPS: 100.0000 mg | ORAL_CAPSULE | Freq: Every day | ORAL | 0 refills | Status: AC | PRN

## 2017-04-21 MED ORDER — LIDOCAINE HCL 2 % EX GEL
1.0000 "application " | Freq: Once | CUTANEOUS | Status: AC
  Administered 2017-04-21: 1 via TOPICAL
  Filled 2017-04-21: qty 5

## 2017-04-21 MED ORDER — POLYETHYLENE GLYCOL 3350 17 G PO PACK: 17.0000 g | PACK | Freq: Every day | ORAL | 0 refills | Status: AC

## 2017-04-21 MED ORDER — SORBITOL 70 % SOLN
960.0000 mL | TOPICAL_OIL | Freq: Once | ORAL | Status: AC
  Administered 2017-04-21: 960 mL via RECTAL
  Filled 2017-04-21: qty 240

## 2017-04-21 NOTE — ED Notes (Signed)
Patient transported to CT 

## 2017-04-21 NOTE — Discharge Instructions (Signed)
Fortunately today your CT scan was reassuring. Please make sure you remain well-hydrated and use magnesium citrate, MiraLAX, and Colace all of which are over-the-counter to help with your constipation.  It was a pleasure to take care of you today, and thank you for coming to our emergency department.  If you have any questions or concerns before leaving please ask the nurse to grab me and I'm more than happy to go through your aftercare instructions again.  If you were prescribed any opioid pain medication today such as Norco, Vicodin, Percocet, morphine, hydrocodone, or oxycodone please make sure you do not drive when you are taking this medication as it can alter your ability to drive safely.  If you have any concerns once you are home that you are not improving or are in fact getting worse before you can make it to your follow-up appointment, please do not hesitate to call 911 and come back for further evaluation.  Darel Hong, MD  Results for orders placed or performed during the hospital encounter of 04/15/17  MRSA PCR Screening  Result Value Ref Range   MRSA by PCR NEGATIVE NEGATIVE  Glucose, capillary  Result Value Ref Range   Glucose-Capillary 126 (H) 65 - 99 mg/dL  Basic metabolic panel  Result Value Ref Range   Sodium 133 (L) 135 - 145 mmol/L   Potassium 3.9 3.5 - 5.1 mmol/L   Chloride 94 (L) 101 - 111 mmol/L   CO2 31 22 - 32 mmol/L   Glucose, Bld 170 (H) 65 - 99 mg/dL   BUN 10 6 - 20 mg/dL   Creatinine, Ser 0.71 0.61 - 1.24 mg/dL   Calcium 8.8 (L) 8.9 - 10.3 mg/dL   GFR calc non Af Amer >60 >60 mL/min   GFR calc Af Amer >60 >60 mL/min   Anion gap 8 5 - 15  CBC  Result Value Ref Range   WBC 15.8 (H) 3.8 - 10.6 K/uL   RBC 4.26 (L) 4.40 - 5.90 MIL/uL   Hemoglobin 12.8 (L) 13.0 - 18.0 g/dL   HCT 38.2 (L) 40.0 - 52.0 %   MCV 89.7 80.0 - 100.0 fL   MCH 30.1 26.0 - 34.0 pg   MCHC 33.5 32.0 - 36.0 g/dL   RDW 13.4 11.5 - 14.5 %   Platelets 389 150 - 440 K/uL  Troponin I  (q 6hr x 3)  Result Value Ref Range   Troponin I <0.03 <0.03 ng/mL   Dg Chest Port 1 View  Result Date: 04/16/2017 CLINICAL DATA:  Recent shoulder surgery with shortness of Breath EXAM: PORTABLE CHEST 1 VIEW COMPARISON:  01/24/2016 FINDINGS: Cardiac shadow is mildly enlarged but stable. Tortuosity of the thoracic aorta with mild calcifications are seen. Cardiac shadow is within normal limits. Mild elevation of the right hemidiaphragm seen. No focal infiltrate or sizable effusion is noted. No acute bony abnormality is seen. IMPRESSION: No acute abnormality noted. Aortic Atherosclerosis (ICD10-170.0) Electronically Signed   By: Inez Catalina M.D.   On: 04/16/2017 13:18   Ct Renal Stone Study  Result Date: 04/21/2017 CLINICAL DATA:  Acute left lower quadrant abdominal pain. EXAM: CT ABDOMEN AND PELVIS WITHOUT CONTRAST TECHNIQUE: Multidetector CT imaging of the abdomen and pelvis was performed following the standard protocol without IV contrast. COMPARISON:  CT scan of February 22, 2017. FINDINGS: Lower chest: No acute abnormality. Hepatobiliary: Status post cholecystectomy. Stable left hepatic cysts. Pancreas: Unremarkable. No pancreatic ductal dilatation or surrounding inflammatory changes. Spleen: Normal in size without focal  abnormality. Adrenals/Urinary Tract: Adrenal glands are unremarkable. Bilateral nephrolithiasis is noted. No hydronephrosis or renal obstruction is noted. Urinary bladder is unremarkable. Stable ablation site is seen involving midpole of left kidney. Stomach/Bowel: Stomach is within normal limits. Appendix appears normal. No evidence of bowel wall thickening, distention, or inflammatory changes. Vascular/Lymphatic: Aortic atherosclerosis. No enlarged abdominal or pelvic lymph nodes. Reproductive: Appears to be status post prostatectomy. Penile reservoir is noted in left lower quadrant. Other: No abdominal wall hernia or abnormality. No abdominopelvic ascites. Musculoskeletal: Status post  left hip arthroplasty. No acute osseous abnormality is noted. IMPRESSION: Bilateral nonobstructive nephrolithiasis. No hydronephrosis or renal obstruction is noted. Stable ablation site is seen involving midpole of left kidney. Aortic atherosclerosis. Electronically Signed   By: Marijo Conception, M.D.   On: 04/21/2017 22:33
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
## 2017-04-22 DIAGNOSIS — K5900 Constipation, unspecified: Secondary | ICD-10-CM | POA: Diagnosis not present

## 2017-04-22 MED ORDER — ONDANSETRON 4 MG PO TBDP
4.0000 mg | ORAL_TABLET | Freq: Once | ORAL | Status: AC
  Administered 2017-04-22: 4 mg via ORAL

## 2017-04-22 MED ORDER — ONDANSETRON 4 MG PO TBDP
ORAL_TABLET | ORAL | Status: DC
Start: 2017-04-22 — End: 2017-04-22
  Filled 2017-04-22: qty 1

## 2017-04-22 NOTE — ED Notes (Signed)
Pt reports nausea, EDP notified, see MAR for follow up.

## 2017-04-28 ENCOUNTER — Encounter: Payer: Self-pay | Admitting: Orthopedic Surgery

## 2017-05-18 ENCOUNTER — Ambulatory Visit
Admission: EM | Admit: 2017-05-18 | Discharge: 2017-05-18 | Discharge status: Absent without leave | Payer: Medicare Other

## 2017-06-14 ENCOUNTER — Encounter: Payer: Self-pay | Admitting: Interventional Radiology

## 2017-07-28 ENCOUNTER — Emergency Department
Admission: EM | Admit: 2017-07-28 | Discharge: 2017-07-28 | Disposition: A | Discharge status: Skilled Nursing Facility | Payer: Medicare Other | Attending: Emergency Medicine | Admitting: Emergency Medicine

## 2017-07-28 ENCOUNTER — Other Ambulatory Visit: Payer: Self-pay

## 2017-07-28 DIAGNOSIS — I252 Old myocardial infarction: Secondary | ICD-10-CM | POA: Insufficient documentation

## 2017-07-28 DIAGNOSIS — Z96651 Presence of right artificial knee joint: Secondary | ICD-10-CM | POA: Insufficient documentation

## 2017-07-28 DIAGNOSIS — F329 Major depressive disorder, single episode, unspecified: Secondary | ICD-10-CM | POA: Diagnosis present

## 2017-07-28 DIAGNOSIS — Z96642 Presence of left artificial hip joint: Secondary | ICD-10-CM | POA: Diagnosis not present

## 2017-07-28 DIAGNOSIS — Z8546 Personal history of malignant neoplasm of prostate: Secondary | ICD-10-CM | POA: Diagnosis not present

## 2017-07-28 DIAGNOSIS — F419 Anxiety disorder, unspecified: Secondary | ICD-10-CM | POA: Diagnosis not present

## 2017-07-28 DIAGNOSIS — Z79899 Other long term (current) drug therapy: Secondary | ICD-10-CM | POA: Diagnosis not present

## 2017-07-28 DIAGNOSIS — F32A Depression, unspecified: Secondary | ICD-10-CM

## 2017-07-28 NOTE — ED Notes (Signed)
D/C paperwork and follow-up instructions reviewed with pt's son who is transporting pt back to [facility].

## 2017-07-28 NOTE — Discharge Instructions (Signed)
Please seek medical attention for any high fevers, chest pain, shortness of breath, change in behavior, persistent vomiting, bloody stool or any other new or concerning symptoms.  

## 2017-07-28 NOTE — ED Notes (Signed)
Pt's POA is Marin Roberts (step-daughter) [phone]

## 2017-07-28 NOTE — ED Triage Notes (Addendum)
Pt arrives to ED via [facility] from [facility] for medical clearance. EMS reports pt having concerns dealing with wife's recent diagnosis of ovarian cancer; pt made a comment about "having a hard time wanting to go on" and facility policy requires pt to be seen for psychiatric evaluation. EMS reports pt with h/x of depression and takes Zoloft as prescribed, but denies any thoughts of SI or suicidal intent. Pt's son arrives with pt and reports pt is NON-ambulatory and wheelchair-bound at baseline.
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
## 2017-08-22 ENCOUNTER — Ambulatory Visit
Admission: EM | Admit: 2017-08-22 | Discharge: 2017-08-22 | Disposition: A | Discharge status: Home / Self Care | Payer: Medicare Other | Attending: Family Medicine | Admitting: Family Medicine

## 2017-08-22 ENCOUNTER — Encounter: Payer: Self-pay | Admitting: Emergency Medicine

## 2017-08-22 ENCOUNTER — Other Ambulatory Visit: Payer: Self-pay

## 2017-08-22 DIAGNOSIS — R2231 Localized swelling, mass and lump, right upper limb: Secondary | ICD-10-CM
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
## 2017-08-22 NOTE — ED Triage Notes (Signed)
Patient c/o right hand swelling that started this morning.  Patient denies pain in his hand.  Patient denies injury or fall.

## 2017-09-09 IMAGING — CT CT RENAL STONE PROTOCOL
2 of 4 series · 16 of 46 positions shown, 18 images · non-contrast
Comparison: CT scan of February 22, 2017.

CLINICAL DATA: Acute left lower quadrant abdominal pain.

EXAM:
CT ABDOMEN AND PELVIS WITHOUT CONTRAST
TECHNIQUE: Multidetector CT imaging of the abdomen and pelvis was performed
following the standard protocol without IV contrast.

[Series 2: stone full standard · axial · 0.80mm/px · z∈[-1205,-775]mm · 13 of 94 slices shown, 15 images]
[im 4/94  soft-tissue]
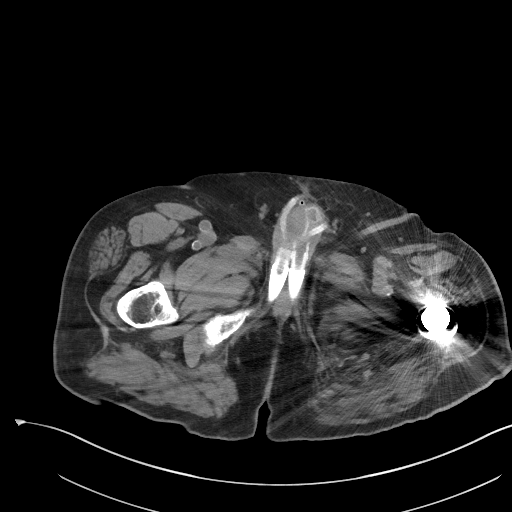
[im 4/94  bone]
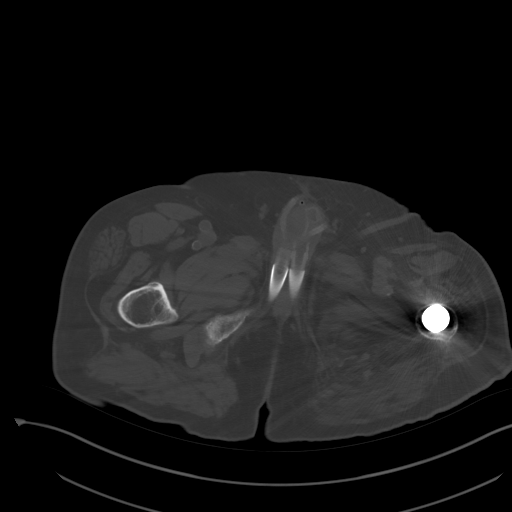
[im 12/94  soft-tissue]
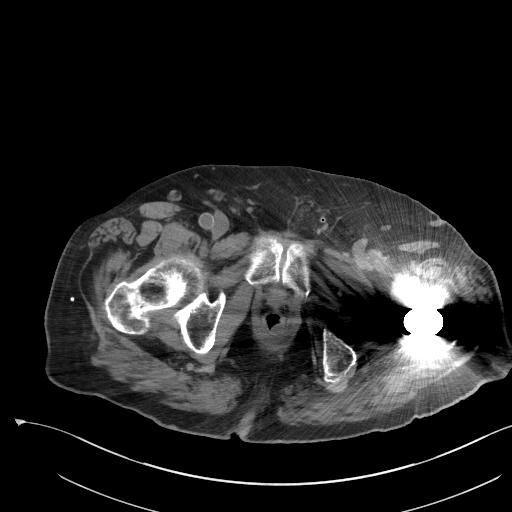
[im 20/94  soft-tissue]
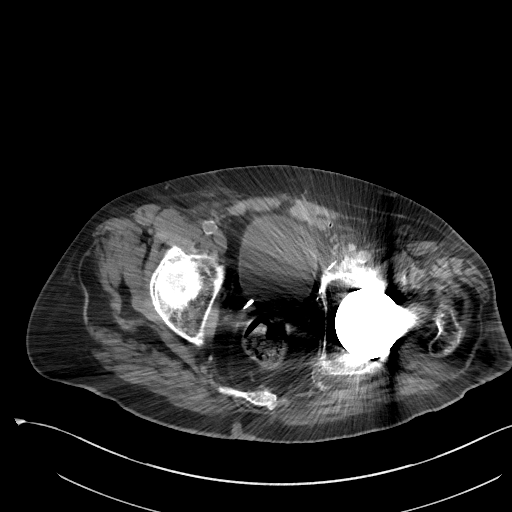
[im 28/94  soft-tissue]
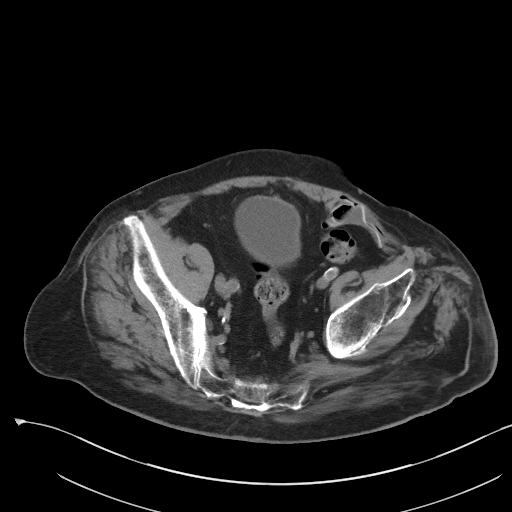
[im 32/94  soft-tissue]
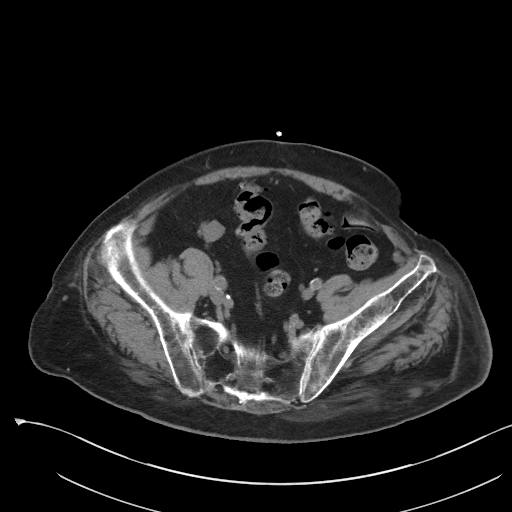
[im 39/94  soft-tissue]
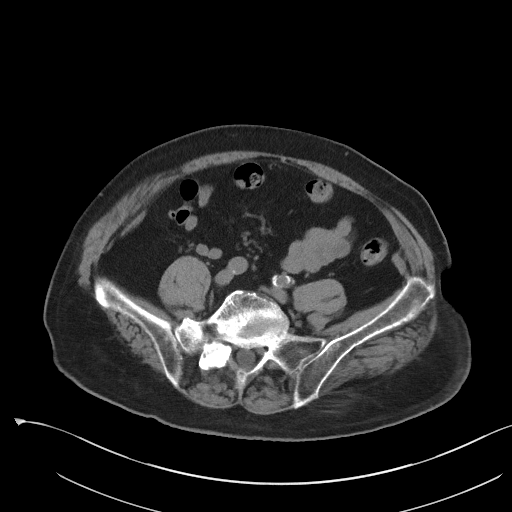
[im 47/94  soft-tissue]
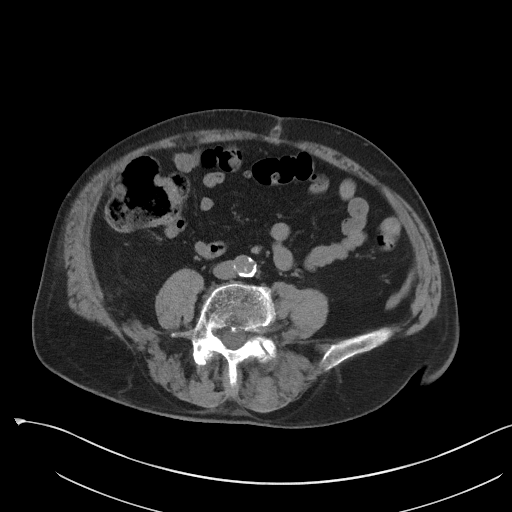
[im 55/94  soft-tissue]
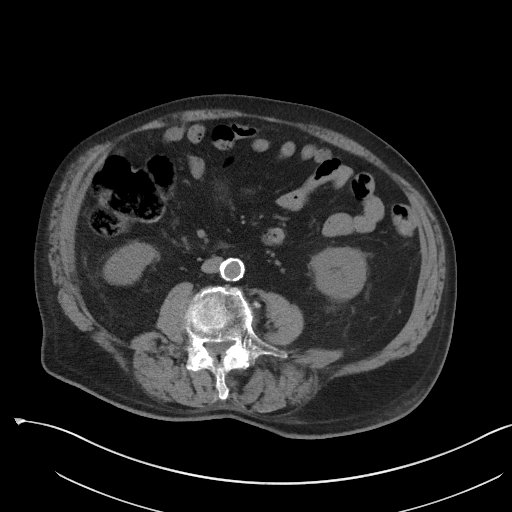
[im 63/94  soft-tissue]
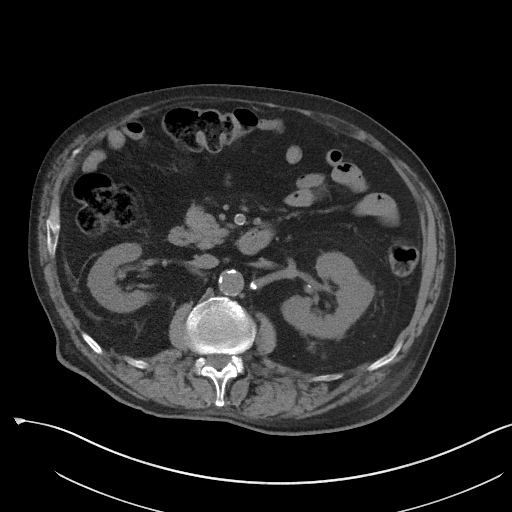
[im 63/94  bone]
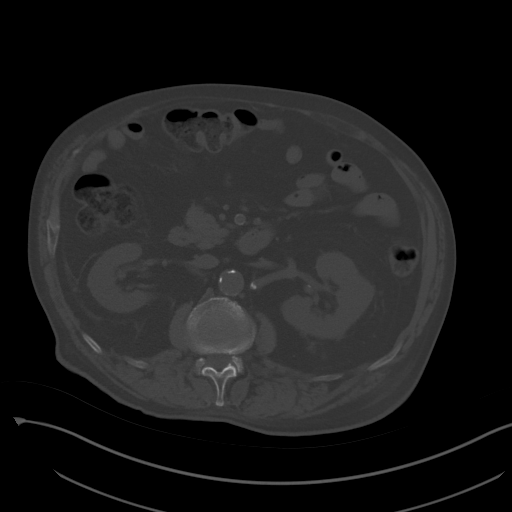
[im 66/94  soft-tissue]
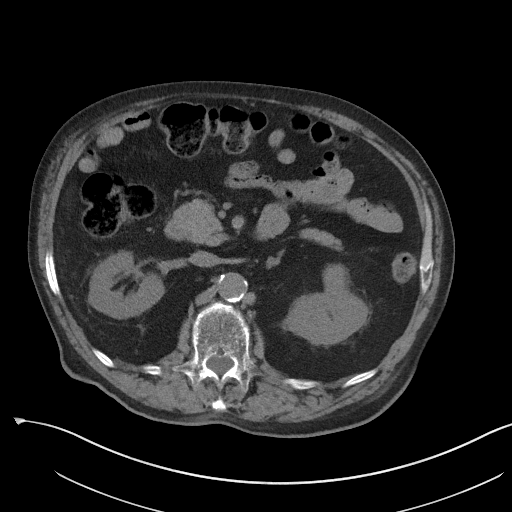
[im 74/94  soft-tissue]
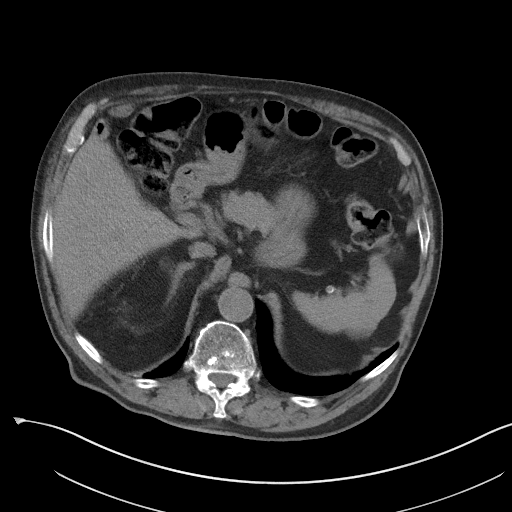
[im 82/94  soft-tissue]
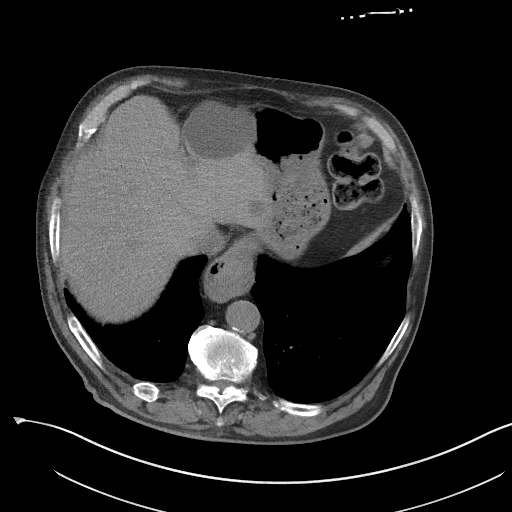
[im 90/94  soft-tissue]
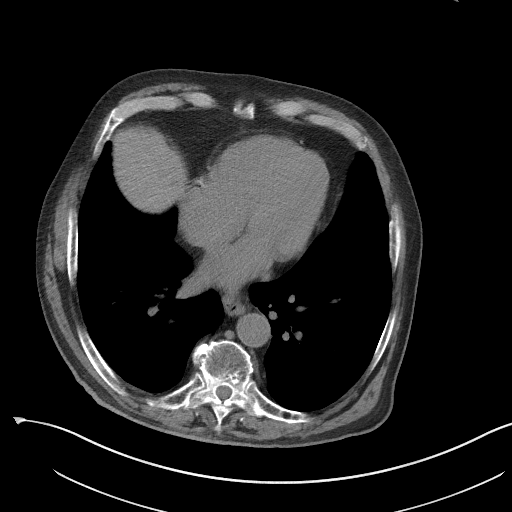

[Series 5: coronal · coronal · 0.79mm/px · 3 of 148 slices shown]
[im 50/148  soft-tissue]
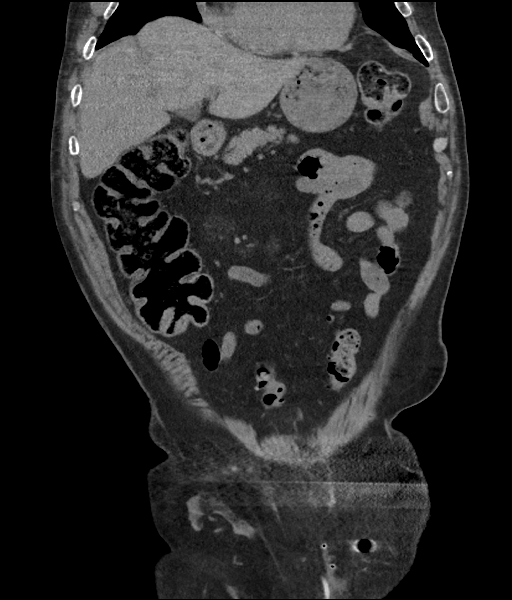
[im 66/148  soft-tissue]
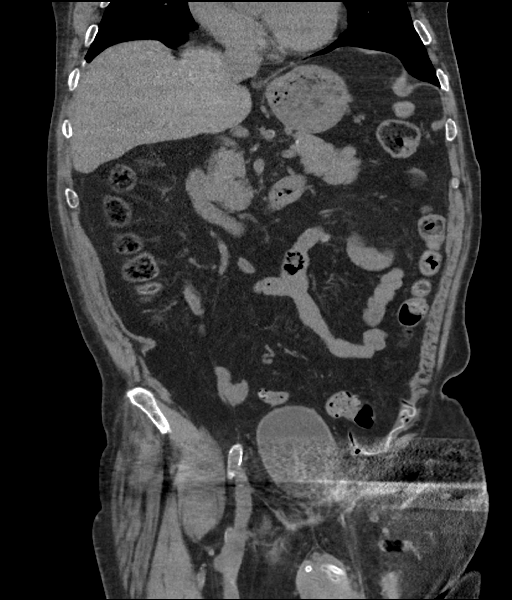
[im 82/148  soft-tissue]
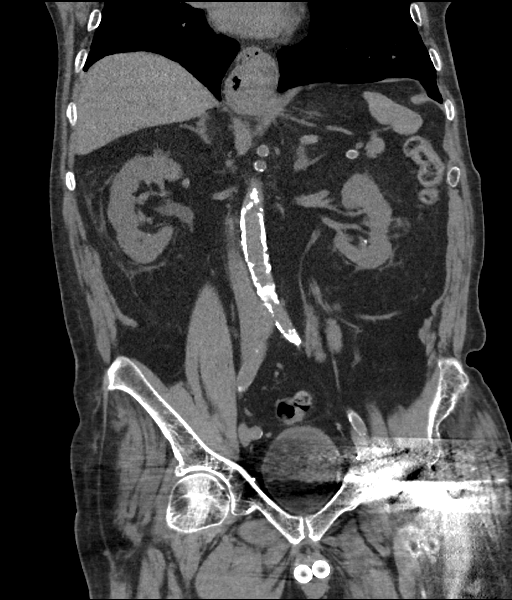

[16 of 46 positions shown; findings below may reference images not displayed]

FINDINGS: Lower chest: No acute abnormality.

Hepatobiliary: Status post cholecystectomy. Stable left hepatic
cysts.

Pancreas: Unremarkable. No pancreatic ductal dilatation or
surrounding inflammatory changes.

Spleen: Normal in size without focal abnormality.

Adrenals/Urinary Tract: Adrenal glands are unremarkable. Bilateral
nephrolithiasis is noted. No hydronephrosis or renal obstruction is
noted. Urinary bladder is unremarkable. Stable ablation site is seen
involving midpole of left kidney.

Stomach/Bowel: Stomach is within normal limits. Appendix appears
normal. No evidence of bowel wall thickening, distention, or
inflammatory changes.

Vascular/Lymphatic: Aortic atherosclerosis. No enlarged abdominal or
pelvic lymph nodes.

Reproductive: Appears to be status post prostatectomy. Penile
reservoir is noted in left lower quadrant.

Other: No abdominal wall hernia or abnormality. No abdominopelvic
ascites.

Musculoskeletal: Status post left hip arthroplasty. No acute osseous
abnormality is noted.
IMPRESSION: Bilateral nonobstructive nephrolithiasis. No hydronephrosis or renal
obstruction is noted.

Stable ablation site is seen involving midpole of left kidney.

Aortic atherosclerosis.

## 2017-09-14 DIAGNOSIS — M75122 Complete rotator cuff tear or rupture of left shoulder, not specified as traumatic: Secondary | ICD-10-CM | POA: Diagnosis not present

## 2017-09-15 ENCOUNTER — Other Ambulatory Visit (HOSPITAL_COMMUNITY): Payer: Self-pay | Admitting: Interventional Radiology

## 2017-09-15 DIAGNOSIS — N2889 Other specified disorders of kidney and ureter: Secondary | ICD-10-CM

## 2017-09-29 DIAGNOSIS — R451 Restlessness and agitation: Secondary | ICD-10-CM | POA: Diagnosis not present

## 2017-09-29 DIAGNOSIS — F419 Anxiety disorder, unspecified: Secondary | ICD-10-CM | POA: Diagnosis not present

## 2017-09-29 DIAGNOSIS — F4320 Adjustment disorder, unspecified: Secondary | ICD-10-CM | POA: Diagnosis not present

## 2017-10-06 ENCOUNTER — Telehealth: Payer: Self-pay | Admitting: Radiology

## 2017-10-06 NOTE — Telephone Encounter (Signed)
Phoned patient to schedule follow up appointment with Dr. Ronny Bacon.  Patient is currently at [facility].  States that he prefers to postpone follow up to late March 2019.  Our office will call back in March to schedule.  Kert Shackett Riki Rusk, RN 10/06/2017 4:56 PM

## 2017-10-11 DIAGNOSIS — Z89612 Acquired absence of left leg above knee: Secondary | ICD-10-CM | POA: Diagnosis not present

## 2017-10-11 DIAGNOSIS — F0390 Unspecified dementia without behavioral disturbance: Secondary | ICD-10-CM | POA: Diagnosis not present

## 2017-10-11 DIAGNOSIS — Z4781 Encounter for orthopedic aftercare following surgical amputation: Secondary | ICD-10-CM | POA: Diagnosis not present

## 2017-10-11 DIAGNOSIS — M6281 Muscle weakness (generalized): Secondary | ICD-10-CM | POA: Diagnosis not present

## 2017-10-26 DIAGNOSIS — R451 Restlessness and agitation: Secondary | ICD-10-CM | POA: Diagnosis not present

## 2017-10-26 DIAGNOSIS — F419 Anxiety disorder, unspecified: Secondary | ICD-10-CM | POA: Diagnosis not present

## 2017-10-26 DIAGNOSIS — F4320 Adjustment disorder, unspecified: Secondary | ICD-10-CM | POA: Diagnosis not present

## 2017-11-23 ENCOUNTER — Emergency Department
Admission: EM | Admit: 2017-11-23 | Discharge: 2017-11-23 | Disposition: A | Discharge status: Skilled Nursing Facility | Payer: PPO | Attending: Emergency Medicine | Admitting: Emergency Medicine

## 2017-11-23 ENCOUNTER — Emergency Department: Payer: PPO

## 2017-11-23 ENCOUNTER — Other Ambulatory Visit: Payer: Self-pay

## 2017-11-23 DIAGNOSIS — F0390 Unspecified dementia without behavioral disturbance: Secondary | ICD-10-CM | POA: Insufficient documentation

## 2017-11-23 DIAGNOSIS — Z79899 Other long term (current) drug therapy: Secondary | ICD-10-CM | POA: Diagnosis not present

## 2017-11-23 DIAGNOSIS — I10 Essential (primary) hypertension: Secondary | ICD-10-CM | POA: Diagnosis not present

## 2017-11-23 DIAGNOSIS — Y93E8 Activity, other personal hygiene: Secondary | ICD-10-CM | POA: Insufficient documentation

## 2017-11-23 DIAGNOSIS — S199XXA Unspecified injury of neck, initial encounter: Secondary | ICD-10-CM | POA: Diagnosis not present

## 2017-11-23 DIAGNOSIS — Z23 Encounter for immunization: Secondary | ICD-10-CM | POA: Insufficient documentation

## 2017-11-23 DIAGNOSIS — S0181XA Laceration without foreign body of other part of head, initial encounter: Secondary | ICD-10-CM | POA: Insufficient documentation

## 2017-11-23 DIAGNOSIS — R062 Wheezing: Secondary | ICD-10-CM | POA: Diagnosis not present

## 2017-11-23 DIAGNOSIS — S0990XA Unspecified injury of head, initial encounter: Secondary | ICD-10-CM | POA: Diagnosis present

## 2017-11-23 DIAGNOSIS — S065XAA Traumatic subdural hemorrhage with loss of consciousness status unknown, initial encounter: Secondary | ICD-10-CM

## 2017-11-23 DIAGNOSIS — S065X9A Traumatic subdural hemorrhage with loss of consciousness of unspecified duration, initial encounter: Secondary | ICD-10-CM

## 2017-11-23 DIAGNOSIS — S065X0A Traumatic subdural hemorrhage without loss of consciousness, initial encounter: Secondary | ICD-10-CM | POA: Insufficient documentation

## 2017-11-23 DIAGNOSIS — Y92121 Bathroom in nursing home as the place of occurrence of the external cause: Secondary | ICD-10-CM | POA: Diagnosis not present

## 2017-11-23 DIAGNOSIS — Y999 Unspecified external cause status: Secondary | ICD-10-CM | POA: Diagnosis not present

## 2017-11-23 DIAGNOSIS — W19XXXA Unspecified fall, initial encounter: Secondary | ICD-10-CM

## 2017-11-23 DIAGNOSIS — W1812XA Fall from or off toilet with subsequent striking against object, initial encounter: Secondary | ICD-10-CM | POA: Diagnosis not present

## 2017-11-23 LAB — PROTIME-INR
INR: 0.91
Prothrombin Time: 12.2 seconds (ref 11.4–15.2)

## 2017-11-23 LAB — APTT: APTT: 34 s (ref 24–36)

## 2017-11-23 MED ORDER — TETANUS-DIPHTH-ACELL PERTUSSIS 5-2.5-18.5 LF-MCG/0.5 IM SUSP
0.5000 mL | Freq: Once | INTRAMUSCULAR | Status: AC
  Administered 2017-11-23: 0.5 mL via INTRAMUSCULAR
  Filled 2017-11-23: qty 0.5

## 2017-11-23 MED ORDER — ACETAMINOPHEN 325 MG PO TABS
650.0000 mg | ORAL_TABLET | Freq: Once | ORAL | Status: AC
  Administered 2017-11-23: 650 mg via ORAL
  Filled 2017-11-23: qty 2

## 2017-11-23 NOTE — Discharge Instructions (Addendum)
keep the wound clean and dry. Don't apply ointment to the area because that will remove the skin glue. Don't soak the area and water as that will also take the skin glue off. Should heal by itself with the glue and we will follow off after a few days. Please return for any increased swelling pain or redness.  please return for any symptoms mentioned in the typewritten instructions. do not take any aspirin or Motrin or any other blood thinners. Please follow-up with your doctor later on this week.  Dr. Lacinda Axon the neurosurgeon from [facility] we will call you to set up a follow-up appointment for check in a few weeks.

## 2017-11-23 NOTE — ED Notes (Signed)
Patient son, Alm Bustard states that dr. Lacinda Axon will probably call him for followup appointment. Spoke with Caitlyn at [facility] about discharge instructions. Pt left with Alm Bustard to go back to [facility]. Pt offere meal tray and is eating before leaving.

## 2017-11-23 NOTE — ED Triage Notes (Signed)
Pt comes into the ED via EMS from [facility] with c/o pt using the BR and when he leaned over to get the toilet paper fell over hit the right side of head, pt has a lac to the right brow, denies LOC , denies any other injures. Pt is above the knee amputation of the left leg

## 2017-11-23 NOTE — ED Notes (Signed)
Pt alert and oriented X4, active, cooperative, pt in NAD. RR even and unlabored, color WNL.  Pt informed to return if any life threatening symptoms occur.  Discharge and followup instructions reviewed.  

## 2017-11-23 NOTE — ED Notes (Signed)
Pt used urinal  

## 2017-11-23 NOTE — ED Notes (Signed)
ED Provider at bedside. 

## 2017-11-23 NOTE — ED Notes (Signed)
Pt back from CT

## 2017-11-23 NOTE — ED Notes (Signed)
Pt urinated in urinal. Denies further needs currently.  Pt alert and oriented X4, active, cooperative, pt in NAD. RR even and unlabored, color WNL.

## 2017-11-23 NOTE — ED Notes (Signed)
Pt returned from CT. Son is at the bedside.

## 2017-11-23 NOTE — Consult Note (Signed)
Neurosurgery-New Consultation Evaluation 11/23/2017 Stephen Fields [id number]  Identifying Statement: Stephen Fields is a 84 y.o. male from [place] [id number] with fall   Physician Requesting Consultation: [facility] ED  History of Present Illness: Stephen Fields had a fall this morning from toilet while having bowel movement. He struck the right side of his head causing a periorbital injury. He denies loss of consciousness. He has having a severe headache. He was brought to ED and Ct scan of the head revealed a small extra-axial hemorrhage in the right frontal area. He states his headache is improving. He is having some ain around his eye. He does not note change of vision in left eye, denies weakness or numbness, and has had no seizure. He is on no antiplatelet or anticoagulant  Past Medical History:  Past Medical History:  Diagnosis Date  . Abnormal EKG    HX OF RIGHT BUNDLE BRANCH BLOCK  . Amputee, above knee, left ([place]) 1967  . Anxiety   . Complication of anesthesia 2003 OR 2004   MI AFTER HIP REPLACMENT SURGERY  . Dementia    mild  . Depression   . GERD (gastroesophageal reflux disease)   . Heart attack ([employer]) 2003 OR 2004   WITH LEFT HIP REPLACMENT SURGERY  . Hemorrhoids    HX OF  . History of kidney stones    X 78YRS AGO  . HLD (hyperlipidemia)   . Hypertension   . Lumbar stenosis   . Lumbar stress fracture 2010   L2 TO L3   . Prostate cancer ([place]) Sep 01, 1993  . Pulmonary embolus ([place]) 2003 OR 2004   AFTER HIP REPLACEMENT  . Renal lesion    LEFT  . Rotator cuff tear SEVERAL YRS AGO, STILL HAS   RIGHT    Social History: Social History   Socioeconomic History  . Marital status: Married    Spouse name: Not on file  . Number of children: Not on file  . Years of education: Not on file  . Highest education level: Not on file  Occupational History  . Not on file  Social Needs  . Financial resource strain: Not on file  . Food insecurity:    Worry: Not on file   Inability: Not on file  . Transportation needs:    Medical: Not on file    Non-medical: Not on file  Tobacco Use  . Smoking status: Never Smoker  . Smokeless tobacco: Never Used  Substance and Sexual Activity  . Alcohol use: No    Alcohol/week: 0.0 oz  . Drug use: No  . Sexual activity: Not on file  Lifestyle  . Physical activity:    Days per week: Not on file    Minutes per session: Not on file  . Stress: Not on file  Relationships  . Social connections:    Talks on phone: Not on file    Gets together: Not on file    Attends religious service: Not on file    Active member of club or organization: Not on file    Attends meetings of clubs or organizations: Not on file    Relationship status: Not on file  . Intimate partner violence:    Fear of current or ex partner: Not on file    Emotionally abused: Not on file    Physically abused: Not on file    Forced sexual activity: Not on file  Other Topics Concern  . Not on file  Social History  Narrative  . Not on file    Family History: Family History  Problem Relation Age of Onset  . Prostate cancer Father   . Bladder Cancer Unknown   . Kidney disease Neg Hx     Review of Systems:  Review of Systems - General ROS: Negative Psychological ROS: Negative Ophthalmic ROS: Positive for eye swelling on right ENT ROS: Negative Hematological and Lymphatic ROS: Negative  Endocrine ROS: Negative Respiratory ROS: Negative Cardiovascular ROS: Negative Gastrointestinal ROS: Negative Genito-Urinary ROS: Negative Musculoskeletal ROS: Negative Neurological ROS: Positive for headache Dermatological ROS: Negative  Physical Exam: BP (!) 164/81   Pulse 76   Temp 98.2 F (36.8 C) (Oral)   Resp 14   Ht 5\' 8"  (1.727 m)   Wt 83.9 kg (185 lb)   SpO2 92%   BMI 28.13 kg/m  Body mass index is 28.13 kg/m. Body surface area is 2.01 meters squared. General appearance: Alert, cooperative, supine in bed Head: Normocephalic, severe  contusion and swelling around right orbit, eyelid able to be opened Eyes: Normal, EOM intact Ext: Left leg amputation  Neurologic exam:  Mental status: alertness: alert, orientation: person, place, time, affect: normal Speech: fluent and clear, naming and repetition intact Cranial nerves:  II: Visual field full in left eye III/IV/VI: extra-ocular motions intact bilaterally V/VII:no evidence of facial droop or weakness  VIII: hearing normal XII: tongue strength symmetric  Motor:strength symmetric 5/5, normal muscle mass and tone in upper and right lower extremities Sensory: intact to light touch in all extremities Gait: not tested in bed  Laboratory: Results for orders placed or performed during the hospital encounter of 11/23/17  Protime-INR  Result Value Ref Range   Prothrombin Time 12.2 11.4 - 15.2 seconds   INR 0.91   APTT  Result Value Ref Range   aPTT 34 24 - 36 seconds   I personally reviewed labs  Imaging: CT head: Right frontal broad-based small subdural hematoma measuring up to 3.5 mm maximal thickness. No significant compression of adjacent brain parenchyma. No obvious fracture  Impression/Plan:  Stephen Fields is here after a fall and small right frontal SDH with no overlying fracture. He has no increased risk for bleeding and given the small size, it is reasonable to get a repeat scan in a few hours and if stable on CT, we can follow up as outpatient. He has no neurological deficits attributable to this. I did caution him on repeat falls.    1.  Diagnosis: Small right frontal SDH  2.  Plan - No aspirin products for 1 week - If repeat scan stable, follow up in clinic in 2-3 weeks for re-evauation

## 2017-11-23 NOTE — ED Notes (Signed)
Pt denies any changes since arrival. Denies any vision changes, weakness, numbness. States the HA has improved slightly since arrival. Family is at the bedside.
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
## 2017-12-07 DIAGNOSIS — S065X9A Traumatic subdural hemorrhage with loss of consciousness of unspecified duration, initial encounter: Secondary | ICD-10-CM | POA: Diagnosis not present

## 2017-12-28 DIAGNOSIS — F4320 Adjustment disorder, unspecified: Secondary | ICD-10-CM | POA: Diagnosis not present

## 2017-12-28 DIAGNOSIS — F419 Anxiety disorder, unspecified: Secondary | ICD-10-CM | POA: Diagnosis not present

## 2017-12-28 DIAGNOSIS — R451 Restlessness and agitation: Secondary | ICD-10-CM | POA: Diagnosis not present

## 2018-01-05 DIAGNOSIS — F419 Anxiety disorder, unspecified: Secondary | ICD-10-CM | POA: Diagnosis not present

## 2018-01-05 DIAGNOSIS — F4320 Adjustment disorder, unspecified: Secondary | ICD-10-CM | POA: Diagnosis not present

## 2018-01-05 DIAGNOSIS — R451 Restlessness and agitation: Secondary | ICD-10-CM | POA: Diagnosis not present

## 2018-01-06 DIAGNOSIS — R451 Restlessness and agitation: Secondary | ICD-10-CM | POA: Diagnosis not present

## 2018-01-06 DIAGNOSIS — F419 Anxiety disorder, unspecified: Secondary | ICD-10-CM | POA: Diagnosis not present

## 2018-01-06 DIAGNOSIS — F4320 Adjustment disorder, unspecified: Secondary | ICD-10-CM | POA: Diagnosis not present

## 2018-01-25 DIAGNOSIS — F4320 Adjustment disorder, unspecified: Secondary | ICD-10-CM | POA: Diagnosis not present

## 2018-01-25 DIAGNOSIS — F419 Anxiety disorder, unspecified: Secondary | ICD-10-CM | POA: Diagnosis not present

## 2018-02-08 DIAGNOSIS — Z89612 Acquired absence of left leg above knee: Secondary | ICD-10-CM | POA: Diagnosis not present

## 2018-02-08 DIAGNOSIS — E538 Deficiency of other specified B group vitamins: Secondary | ICD-10-CM | POA: Diagnosis not present

## 2018-02-08 DIAGNOSIS — M48062 Spinal stenosis, lumbar region with neurogenic claudication: Secondary | ICD-10-CM | POA: Diagnosis not present

## 2018-02-08 DIAGNOSIS — Z0000 Encounter for general adult medical examination without abnormal findings: Secondary | ICD-10-CM | POA: Diagnosis not present

## 2018-02-08 DIAGNOSIS — Z8679 Personal history of other diseases of the circulatory system: Secondary | ICD-10-CM | POA: Diagnosis not present

## 2018-02-08 DIAGNOSIS — Z79899 Other long term (current) drug therapy: Secondary | ICD-10-CM | POA: Diagnosis not present

## 2018-02-22 DIAGNOSIS — F419 Anxiety disorder, unspecified: Secondary | ICD-10-CM | POA: Diagnosis not present

## 2018-02-22 DIAGNOSIS — F4320 Adjustment disorder, unspecified: Secondary | ICD-10-CM | POA: Diagnosis not present

## 2018-03-17 ENCOUNTER — Telehealth: Payer: Self-pay | Admitting: Radiology

## 2018-03-17 NOTE — Telephone Encounter (Signed)
Called patient to schedule follow up appointments as recommended by Dr. Ronny Bacon.  Patient states that he has decided to follow up with his primary care physician for all health care.  Dr. Pascal Lux notified.  Anelisse Jacobson Riki Rusk, RN 03/17/2018 3:06 PM

## 2018-03-30 DIAGNOSIS — F4320 Adjustment disorder, unspecified: Secondary | ICD-10-CM | POA: Diagnosis not present

## 2018-03-30 DIAGNOSIS — F419 Anxiety disorder, unspecified: Secondary | ICD-10-CM | POA: Diagnosis not present

## 2018-03-30 DIAGNOSIS — F5102 Adjustment insomnia: Secondary | ICD-10-CM | POA: Diagnosis not present

## 2018-04-19 DIAGNOSIS — F5102 Adjustment insomnia: Secondary | ICD-10-CM | POA: Diagnosis not present

## 2018-04-19 DIAGNOSIS — F4320 Adjustment disorder, unspecified: Secondary | ICD-10-CM | POA: Diagnosis not present

## 2018-04-19 DIAGNOSIS — F419 Anxiety disorder, unspecified: Secondary | ICD-10-CM | POA: Diagnosis not present

## 2018-05-24 DIAGNOSIS — F329 Major depressive disorder, single episode, unspecified: Secondary | ICD-10-CM | POA: Diagnosis not present

## 2018-05-24 DIAGNOSIS — F5102 Adjustment insomnia: Secondary | ICD-10-CM | POA: Diagnosis not present

## 2018-05-24 DIAGNOSIS — F419 Anxiety disorder, unspecified: Secondary | ICD-10-CM | POA: Diagnosis not present

## 2018-05-24 DIAGNOSIS — R4189 Other symptoms and signs involving cognitive functions and awareness: Secondary | ICD-10-CM | POA: Diagnosis not present

## 2018-05-27 DIAGNOSIS — E785 Hyperlipidemia, unspecified: Secondary | ICD-10-CM | POA: Diagnosis not present

## 2018-05-27 DIAGNOSIS — E119 Type 2 diabetes mellitus without complications: Secondary | ICD-10-CM | POA: Diagnosis not present

## 2018-05-27 DIAGNOSIS — D649 Anemia, unspecified: Secondary | ICD-10-CM | POA: Diagnosis not present

## 2018-05-27 DIAGNOSIS — Z79899 Other long term (current) drug therapy: Secondary | ICD-10-CM | POA: Diagnosis not present

## 2018-05-27 DIAGNOSIS — E039 Hypothyroidism, unspecified: Secondary | ICD-10-CM | POA: Diagnosis not present

## 2018-06-28 DIAGNOSIS — F329 Major depressive disorder, single episode, unspecified: Secondary | ICD-10-CM | POA: Diagnosis not present

## 2018-06-28 DIAGNOSIS — R4189 Other symptoms and signs involving cognitive functions and awareness: Secondary | ICD-10-CM | POA: Diagnosis not present

## 2018-06-28 DIAGNOSIS — F419 Anxiety disorder, unspecified: Secondary | ICD-10-CM | POA: Diagnosis not present

## 2018-06-28 DIAGNOSIS — F5102 Adjustment insomnia: Secondary | ICD-10-CM | POA: Diagnosis not present

## 2018-07-19 DIAGNOSIS — F329 Major depressive disorder, single episode, unspecified: Secondary | ICD-10-CM | POA: Diagnosis not present

## 2018-07-19 DIAGNOSIS — F419 Anxiety disorder, unspecified: Secondary | ICD-10-CM | POA: Diagnosis not present

## 2018-07-19 DIAGNOSIS — F5102 Adjustment insomnia: Secondary | ICD-10-CM | POA: Diagnosis not present

## 2018-07-19 DIAGNOSIS — R4189 Other symptoms and signs involving cognitive functions and awareness: Secondary | ICD-10-CM | POA: Diagnosis not present

## 2018-08-11 DIAGNOSIS — Z125 Encounter for screening for malignant neoplasm of prostate: Secondary | ICD-10-CM | POA: Diagnosis not present

## 2018-08-11 DIAGNOSIS — M48062 Spinal stenosis, lumbar region with neurogenic claudication: Secondary | ICD-10-CM | POA: Diagnosis not present

## 2018-08-11 DIAGNOSIS — Z79899 Other long term (current) drug therapy: Secondary | ICD-10-CM | POA: Diagnosis not present

## 2018-08-14 ENCOUNTER — Other Ambulatory Visit: Payer: Self-pay

## 2018-08-14 ENCOUNTER — Emergency Department: Payer: PPO

## 2018-08-14 ENCOUNTER — Encounter: Admission: EM | Disposition: E | Payer: Self-pay | Source: Home / Self Care | Attending: Internal Medicine

## 2018-08-14 ENCOUNTER — Inpatient Hospital Stay
Admission: EM | Admit: 2018-08-14 | Discharge: 2018-08-31 | DRG: 871 | Disposition: E | Payer: PPO | Attending: Internal Medicine | Admitting: Internal Medicine

## 2018-08-14 DIAGNOSIS — J9601 Acute respiratory failure with hypoxia: Secondary | ICD-10-CM | POA: Diagnosis present

## 2018-08-14 DIAGNOSIS — I469 Cardiac arrest, cause unspecified: Secondary | ICD-10-CM | POA: Diagnosis not present

## 2018-08-14 DIAGNOSIS — I4510 Unspecified right bundle-branch block: Secondary | ICD-10-CM | POA: Diagnosis present

## 2018-08-14 DIAGNOSIS — R6521 Severe sepsis with septic shock: Secondary | ICD-10-CM | POA: Diagnosis not present

## 2018-08-14 DIAGNOSIS — R578 Other shock: Secondary | ICD-10-CM | POA: Diagnosis not present

## 2018-08-14 DIAGNOSIS — I10 Essential (primary) hypertension: Secondary | ICD-10-CM | POA: Diagnosis not present

## 2018-08-14 DIAGNOSIS — K922 Gastrointestinal hemorrhage, unspecified: Secondary | ICD-10-CM | POA: Diagnosis not present

## 2018-08-14 DIAGNOSIS — R231 Pallor: Secondary | ICD-10-CM | POA: Diagnosis not present

## 2018-08-14 DIAGNOSIS — Z8042 Family history of malignant neoplasm of prostate: Secondary | ICD-10-CM | POA: Diagnosis not present

## 2018-08-14 DIAGNOSIS — Z9079 Acquired absence of other genital organ(s): Secondary | ICD-10-CM | POA: Diagnosis not present

## 2018-08-14 DIAGNOSIS — R110 Nausea: Secondary | ICD-10-CM | POA: Diagnosis not present

## 2018-08-14 DIAGNOSIS — Z96649 Presence of unspecified artificial hip joint: Secondary | ICD-10-CM | POA: Diagnosis not present

## 2018-08-14 DIAGNOSIS — J9600 Acute respiratory failure, unspecified whether with hypoxia or hypercapnia: Secondary | ICD-10-CM

## 2018-08-14 DIAGNOSIS — Z89612 Acquired absence of left leg above knee: Secondary | ICD-10-CM | POA: Diagnosis not present

## 2018-08-14 DIAGNOSIS — Z86711 Personal history of pulmonary embolism: Secondary | ICD-10-CM | POA: Diagnosis not present

## 2018-08-14 DIAGNOSIS — R059 Cough, unspecified: Secondary | ICD-10-CM

## 2018-08-14 DIAGNOSIS — K921 Melena: Secondary | ICD-10-CM | POA: Diagnosis present

## 2018-08-14 DIAGNOSIS — Z96651 Presence of right artificial knee joint: Secondary | ICD-10-CM | POA: Diagnosis present

## 2018-08-14 DIAGNOSIS — E785 Hyperlipidemia, unspecified: Secondary | ICD-10-CM | POA: Diagnosis not present

## 2018-08-14 DIAGNOSIS — Z515 Encounter for palliative care: Secondary | ICD-10-CM | POA: Diagnosis present

## 2018-08-14 DIAGNOSIS — Z79899 Other long term (current) drug therapy: Secondary | ICD-10-CM | POA: Diagnosis not present

## 2018-08-14 DIAGNOSIS — Z87442 Personal history of urinary calculi: Secondary | ICD-10-CM | POA: Diagnosis not present

## 2018-08-14 DIAGNOSIS — F0390 Unspecified dementia without behavioral disturbance: Secondary | ICD-10-CM | POA: Diagnosis present

## 2018-08-14 DIAGNOSIS — Z8546 Personal history of malignant neoplasm of prostate: Secondary | ICD-10-CM | POA: Diagnosis not present

## 2018-08-14 DIAGNOSIS — R0902 Hypoxemia: Secondary | ICD-10-CM | POA: Diagnosis not present

## 2018-08-14 DIAGNOSIS — A419 Sepsis, unspecified organism: Principal | ICD-10-CM | POA: Diagnosis present

## 2018-08-14 DIAGNOSIS — I252 Old myocardial infarction: Secondary | ICD-10-CM

## 2018-08-14 DIAGNOSIS — R1111 Vomiting without nausea: Secondary | ICD-10-CM | POA: Diagnosis not present

## 2018-08-14 DIAGNOSIS — R05 Cough: Secondary | ICD-10-CM

## 2018-08-14 DIAGNOSIS — N179 Acute kidney failure, unspecified: Secondary | ICD-10-CM

## 2018-08-14 DIAGNOSIS — J690 Pneumonitis due to inhalation of food and vomit: Secondary | ICD-10-CM | POA: Diagnosis present

## 2018-08-14 DIAGNOSIS — K219 Gastro-esophageal reflux disease without esophagitis: Secondary | ICD-10-CM | POA: Diagnosis present

## 2018-08-14 DIAGNOSIS — I959 Hypotension, unspecified: Secondary | ICD-10-CM | POA: Diagnosis not present

## 2018-08-14 DIAGNOSIS — Z66 Do not resuscitate: Secondary | ICD-10-CM | POA: Diagnosis not present

## 2018-08-14 DIAGNOSIS — R0602 Shortness of breath: Secondary | ICD-10-CM | POA: Diagnosis not present

## 2018-08-14 DIAGNOSIS — K920 Hematemesis: Secondary | ICD-10-CM | POA: Diagnosis present

## 2018-08-14 DIAGNOSIS — Z452 Encounter for adjustment and management of vascular access device: Secondary | ICD-10-CM | POA: Diagnosis not present

## 2018-08-14 DIAGNOSIS — Z4682 Encounter for fitting and adjustment of non-vascular catheter: Secondary | ICD-10-CM | POA: Diagnosis not present

## 2018-08-14 LAB — URINALYSIS, COMPLETE (UACMP) WITH MICROSCOPIC
Bacteria, UA: NONE SEEN
Bilirubin Urine: NEGATIVE
Glucose, UA: 50 mg/dL — AB
Ketones, ur: NEGATIVE mg/dL
Nitrite: NEGATIVE
Protein, ur: 100 mg/dL — AB
RBC / HPF: >50 RBC/hpf — ABNORMAL HIGH (ref 0–5)
Specific Gravity, Urine: 1.024 (ref 1.005–1.030)
WBC, UA: >50 WBC/hpf — ABNORMAL HIGH (ref 0–5)
pH: 5 (ref 5.0–8.0)

## 2018-08-14 LAB — COMPREHENSIVE METABOLIC PANEL
ALT: 21 U/L (ref 0–44)
ANION GAP: 17 — AB (ref 5–15)
AST: 34 U/L (ref 15–41)
Albumin: 4.3 g/dL (ref 3.5–5.0)
Alkaline Phosphatase: 73 U/L (ref 38–126)
BUN: 36 mg/dL — ABNORMAL HIGH (ref 8–23)
CO2: 26 mmol/L (ref 22–32)
Calcium: 9.4 mg/dL (ref 8.9–10.3)
Chloride: 97 mmol/L — ABNORMAL LOW (ref 98–111)
Creatinine, Ser: 2.54 mg/dL — ABNORMAL HIGH (ref 0.61–1.24)
GFR calc Af Amer: 26 mL/min — ABNORMAL LOW (ref >60)
GFR calc non Af Amer: 22 mL/min — ABNORMAL LOW (ref >60)
Glucose, Bld: 203 mg/dL — ABNORMAL HIGH (ref 70–99)
POTASSIUM: 3.1 mmol/L — AB (ref 3.5–5.1)
Sodium: 140 mmol/L (ref 135–145)
Total Bilirubin: 0.8 mg/dL (ref 0.3–1.2)
Total Protein: 8.2 g/dL — ABNORMAL HIGH (ref 6.5–8.1)

## 2018-08-14 LAB — CBC WITH DIFFERENTIAL/PLATELET
Abs Immature Granulocytes: 0.12 10*3/uL — ABNORMAL HIGH (ref 0.00–0.07)
BASOS PCT: 0 %
Basophils Absolute: 0 10*3/uL (ref 0.0–0.1)
Eosinophils Absolute: 0.1 10*3/uL (ref 0.0–0.5)
Eosinophils Relative: 0 %
HCT: 45.9 % (ref 39.0–52.0)
Hemoglobin: 14.7 g/dL (ref 13.0–17.0)
Immature Granulocytes: 1 %
Lymphocytes Relative: 4 %
Lymphs Abs: 0.7 10*3/uL (ref 0.7–4.0)
MCH: 29.6 pg (ref 26.0–34.0)
MCHC: 32 g/dL (ref 30.0–36.0)
MCV: 92.4 fL (ref 80.0–100.0)
Monocytes Absolute: 1.1 10*3/uL — ABNORMAL HIGH (ref 0.1–1.0)
Monocytes Relative: 7 %
Neutro Abs: 14.4 10*3/uL — ABNORMAL HIGH (ref 1.7–7.7)
Neutrophils Relative %: 88 %
PLATELETS: 451 10*3/uL — AB (ref 150–400)
RBC: 4.97 MIL/uL (ref 4.22–5.81)
RDW: 13.3 % (ref 11.5–15.5)
WBC: 16.4 10*3/uL — ABNORMAL HIGH (ref 4.0–10.5)
nRBC: 0 % (ref 0.0–0.2)

## 2018-08-14 LAB — APTT
APTT: 34 s (ref 24–36)
aPTT: 33 seconds (ref 24–36)

## 2018-08-14 LAB — POCT I-STAT, CHEM 8
BUN: 35 mg/dL — ABNORMAL HIGH (ref 8–23)
CALCIUM ION: 1.12 mmol/L — AB (ref 1.15–1.40)
Chloride: 100 mmol/L (ref 98–111)
Creatinine, Ser: 2.5 mg/dL — ABNORMAL HIGH (ref 0.61–1.24)
Glucose, Bld: 192 mg/dL — ABNORMAL HIGH (ref 70–99)
HCT: 45 % (ref 39.0–52.0)
HEMOGLOBIN: 15.3 g/dL (ref 13.0–17.0)
Potassium: 3 mmol/L — ABNORMAL LOW (ref 3.5–5.1)
Sodium: 140 mmol/L (ref 135–145)
TCO2: 29 mmol/L (ref 22–32)

## 2018-08-14 LAB — MRSA PCR SCREENING: MRSA BY PCR: NEGATIVE

## 2018-08-14 LAB — PROTIME-INR
INR: 0.92
INR: 1.08
Prothrombin Time: 12.3 seconds (ref 11.4–15.2)
Prothrombin Time: 13.9 seconds (ref 11.4–15.2)

## 2018-08-14 LAB — CBC
HCT: 53.2 % — ABNORMAL HIGH (ref 39.0–52.0)
HEMOGLOBIN: 16.7 g/dL (ref 13.0–17.0)
MCH: 29.5 pg (ref 26.0–34.0)
MCHC: 31.4 g/dL (ref 30.0–36.0)
MCV: 94 fL (ref 80.0–100.0)
Platelets: 348 10*3/uL (ref 150–400)
RBC: 5.66 MIL/uL (ref 4.22–5.81)
RDW: 13.9 % (ref 11.5–15.5)
WBC: 6.3 10*3/uL (ref 4.0–10.5)
nRBC: 0 % (ref 0.0–0.2)

## 2018-08-14 LAB — HEMOGLOBIN AND HEMATOCRIT, BLOOD
HCT: 45.4 % (ref 39.0–52.0)
Hemoglobin: 14.2 g/dL (ref 13.0–17.0)

## 2018-08-14 LAB — FIBRIN DERIVATIVES D-DIMER (ARMC ONLY): Fibrin derivatives D-dimer (ARMC): >7500 ng/mL (FEU) — ABNORMAL HIGH (ref 0.00–499.00)

## 2018-08-14 LAB — TECHNOLOGIST SMEAR REVIEW

## 2018-08-14 LAB — CG4 I-STAT (LACTIC ACID): LACTIC ACID, VENOUS: 6.16 mmol/L — AB (ref 0.5–1.9)

## 2018-08-14 LAB — PLATELET COUNT: Platelets: 308 10*3/uL (ref 150–400)

## 2018-08-14 LAB — TROPONIN I

## 2018-08-14 LAB — FIBRINOGEN: Fibrinogen: 355 mg/dL (ref 210–475)

## 2018-08-14 LAB — ABO/RH: ABO/RH(D): O POS

## 2018-08-14 LAB — HEMOGLOBIN: Hemoglobin: 17.4 g/dL — ABNORMAL HIGH (ref 13.0–17.0)

## 2018-08-14 SURGERY — EGD (ESOPHAGOGASTRODUODENOSCOPY)

## 2018-08-14 MED ORDER — METOCLOPRAMIDE HCL 5 MG/ML IJ SOLN
10.0000 mg | Freq: Once | INTRAMUSCULAR | Status: AC
  Administered 2018-08-14: 10 mg via INTRAVENOUS

## 2018-08-14 MED ORDER — IPRATROPIUM-ALBUTEROL 0.5-2.5 (3) MG/3ML IN SOLN
RESPIRATORY_TRACT | Status: AC
  Filled 2018-08-14: qty 3

## 2018-08-14 MED ORDER — SODIUM CHLORIDE 0.9 % IV BOLUS
1000.0000 mL | Freq: Once | INTRAVENOUS | Status: AC
  Administered 2018-08-14: 1000 mL via INTRAVENOUS

## 2018-08-14 MED ORDER — SODIUM CHLORIDE 0.9 % IV SOLN
80.0000 mg | Freq: Once | INTRAVENOUS | Status: AC
  Administered 2018-08-14: 80 mg via INTRAVENOUS
  Filled 2018-08-14: qty 40

## 2018-08-14 MED ORDER — KETAMINE HCL 10 MG/ML IJ SOLN
80.0000 mg | Freq: Once | INTRAMUSCULAR | Status: AC
  Administered 2018-08-14: 80 mg via INTRAVENOUS

## 2018-08-14 MED ORDER — SODIUM CHLORIDE 0.9 % IV SOLN
INTRAVENOUS | Status: DC
  Administered 2018-08-14 (×2): via INTRAVENOUS

## 2018-08-14 MED ORDER — SODIUM CHLORIDE 0.9 % IV SOLN
3.0000 g | Freq: Once | INTRAVENOUS | Status: AC
  Administered 2018-08-14: 3 g via INTRAVENOUS
  Filled 2018-08-14: qty 3

## 2018-08-14 MED ORDER — ACETAMINOPHEN 650 MG RE SUPP: 650.0000 mg | Freq: Four times a day (QID) | RECTAL | Status: DC | PRN

## 2018-08-14 MED ORDER — DEXMEDETOMIDINE HCL IN NACL 400 MCG/100ML IV SOLN
0.4000 ug/kg/h | INTRAVENOUS | Status: DC
  Administered 2018-08-14: 0.4 ug/kg/h via INTRAVENOUS

## 2018-08-14 MED ORDER — ONDANSETRON HCL 4 MG/2ML IJ SOLN
INTRAMUSCULAR | Status: AC
  Filled 2018-08-14: qty 2

## 2018-08-14 MED ORDER — ACETAMINOPHEN 325 MG PO TABS: 650.0000 mg | ORAL_TABLET | Freq: Four times a day (QID) | ORAL | Status: DC | PRN

## 2018-08-14 MED ORDER — METOCLOPRAMIDE HCL 5 MG/ML IJ SOLN
INTRAMUSCULAR | Status: AC
  Filled 2018-08-14: qty 2

## 2018-08-14 MED ORDER — ONDANSETRON HCL 4 MG/2ML IJ SOLN
4.0000 mg | Freq: Once | INTRAMUSCULAR | Status: AC
  Administered 2018-08-14: 4 mg via INTRAVENOUS

## 2018-08-14 MED ORDER — IPRATROPIUM-ALBUTEROL 0.5-2.5 (3) MG/3ML IN SOLN
RESPIRATORY_TRACT | Status: AC
  Filled 2018-08-14: qty 6

## 2018-08-14 MED ORDER — ORAL CARE MOUTH RINSE
15.0000 mL | OROMUCOSAL | Status: DC
  Administered 2018-08-14 (×2): 15 mL via OROMUCOSAL

## 2018-08-14 MED ORDER — SODIUM CHLORIDE 0.9 % IV SOLN
8.0000 mg/h | INTRAVENOUS | Status: DC
  Administered 2018-08-14: 8 mg/h via INTRAVENOUS
  Filled 2018-08-14: qty 80

## 2018-08-14 MED ORDER — ROCURONIUM BROMIDE 50 MG/5ML IV SOLN
80.0000 mg | Freq: Once | INTRAVENOUS | Status: AC
  Administered 2018-08-14: 80 mg via INTRAVENOUS
  Filled 2018-08-14: qty 8

## 2018-08-14 MED ORDER — SODIUM CHLORIDE 0.9 % IV SOLN
3.0000 g | Freq: Two times a day (BID) | INTRAVENOUS | Status: DC
  Filled 2018-08-14 (×2): qty 3

## 2018-08-14 MED ORDER — SODIUM CHLORIDE 0.9 % IV SOLN: 10.0000 mL/h | Freq: Once | INTRAVENOUS | Status: DC

## 2018-08-14 MED ORDER — CHLORHEXIDINE GLUCONATE 0.12% ORAL RINSE (MEDLINE KIT)
15.0000 mL | Freq: Two times a day (BID) | OROMUCOSAL | Status: DC
  Administered 2018-08-14: 15 mL via OROMUCOSAL

## 2018-08-14 MED ORDER — PANTOPRAZOLE SODIUM 40 MG IV SOLR: 40.0000 mg | Freq: Two times a day (BID) | INTRAVENOUS | Status: DC

## 2018-08-14 MED ORDER — FENTANYL 2500MCG IN NS 250ML (10MCG/ML) PREMIX INFUSION
0.0000 ug/h | INTRAVENOUS | Status: DC
  Administered 2018-08-14: 100 ug/h via INTRAVENOUS
  Filled 2018-08-14: qty 250

## 2018-08-14 MED ORDER — NOREPINEPHRINE 4 MG/250ML-% IV SOLN: 0.0000 ug/min | INTRAVENOUS | Status: DC

## 2018-08-14 MED ORDER — NOREPINEPHRINE 16 MG/250ML-% IV SOLN
0.0000 ug/min | INTRAVENOUS | Status: DC
  Administered 2018-08-14: 10 ug/min via INTRAVENOUS
  Filled 2018-08-14: qty 250

## 2018-08-14 MED ORDER — DEXMEDETOMIDINE HCL IN NACL 400 MCG/100ML IV SOLN
0.4000 ug/kg/h | INTRAVENOUS | Status: DC
  Administered 2018-08-14: 1.2 ug/kg/h via INTRAVENOUS
  Administered 2018-08-14: 0.4 ug/kg/h via INTRAVENOUS
  Filled 2018-08-14: qty 100

## 2018-08-14 MED ORDER — NOREPINEPHRINE 4 MG/250ML-% IV SOLN
INTRAVENOUS | Status: AC
  Filled 2018-08-14: qty 250

## 2018-08-14 MED ORDER — IPRATROPIUM-ALBUTEROL 0.5-2.5 (3) MG/3ML IN SOLN
6.0000 mL | Freq: Once | RESPIRATORY_TRACT | Status: AC
  Administered 2018-08-14: 6 mL via RESPIRATORY_TRACT

## 2018-08-14 MED ORDER — EPINEPHRINE PF 1 MG/10ML IJ SOSY
PREFILLED_SYRINGE | INTRAMUSCULAR | Status: AC | PRN
  Administered 2018-08-14: 1 mg via INTRAVENOUS

## 2018-08-14 MED ORDER — NOREPINEPHRINE 4 MG/250ML-% IV SOLN
0.0000 ug/min | INTRAVENOUS | Status: DC
  Administered 2018-08-14: 5 ug/min via INTRAVENOUS

## 2018-08-14 MED ORDER — ONDANSETRON HCL 4 MG/2ML IJ SOLN: 4.0000 mg | Freq: Four times a day (QID) | INTRAMUSCULAR | Status: DC | PRN

## 2018-08-14 MED ORDER — SODIUM CHLORIDE 0.9 % IV SOLN
3.0000 g | Freq: Four times a day (QID) | INTRAVENOUS | Status: DC
  Filled 2018-08-14 (×3): qty 3

## 2018-08-14 MED ORDER — SODIUM CHLORIDE 0.9 % IV SOLN: 8.0000 mg/h | INTRAVENOUS | Status: DC

## 2018-08-14 MED ORDER — TRANEXAMIC ACID-NACL 1000-0.7 MG/100ML-% IV SOLN
1000.0000 mg | INTRAVENOUS | Status: AC
  Administered 2018-08-14: 1000 mg via INTRAVENOUS
  Filled 2018-08-14 (×2): qty 100

## 2018-08-14 MED ORDER — ONDANSETRON HCL 4 MG PO TABS: 4.0000 mg | ORAL_TABLET | Freq: Four times a day (QID) | ORAL | Status: DC | PRN

## 2018-08-14 MED FILL — Medication: Qty: 1 | Status: CN

## 2018-08-15 LAB — BPAM PLATELET PHERESIS
Blood Product Expiration Date: 201912162359
Blood Product Expiration Date: 201912162359
Unit Type and Rh: 5100
Unit Type and Rh: 5100

## 2018-08-15 LAB — PREPARE PLATELET PHERESIS
UNIT DIVISION: 0
Unit division: 0

## 2018-08-15 LAB — BPAM FFP
Blood Product Expiration Date: 201912202359
Blood Product Expiration Date: 201912202359
ISSUE DATE / TIME: 201912151237
Unit Type and Rh: 5100
Unit Type and Rh: 5100

## 2018-08-15 LAB — PREPARE FRESH FROZEN PLASMA: Unit division: 0

## 2018-08-15 LAB — GLUCOSE, CAPILLARY: Glucose-Capillary: 239 mg/dL — ABNORMAL HIGH (ref 70–99)

## 2018-08-16 ENCOUNTER — Telehealth: Payer: Self-pay

## 2018-08-17 LAB — TYPE AND SCREEN
ABO/RH(D): O POS
Antibody Screen: NEGATIVE
UNIT DIVISION: 0
UNIT DIVISION: 0
UNIT DIVISION: 0
Unit division: 0
Unit division: 0
Unit division: 0
Unit division: 0
Unit division: 0
Unit division: 0
Unit division: 0

## 2018-08-17 LAB — BPAM RBC
Blood Product Expiration Date: 202001082359
Blood Product Expiration Date: 202001102359
Blood Product Expiration Date: 202001112359
Blood Product Expiration Date: 202001112359
Blood Product Expiration Date: 202001112359
Blood Product Expiration Date: 202001112359
Blood Product Expiration Date: 202001112359
Blood Product Expiration Date: 202001112359
Blood Product Expiration Date: 202001112359
Blood Product Expiration Date: 202001112359
ISSUE DATE / TIME: 201912151108
ISSUE DATE / TIME: 201912151204
ISSUE DATE / TIME: 201912151223
ISSUE DATE / TIME: 201912151223
ISSUE DATE / TIME: 201912151223
ISSUE DATE / TIME: 201912151223
UNIT TYPE AND RH: 5100
UNIT TYPE AND RH: 5100
Unit Type and Rh: 5100
Unit Type and Rh: 5100
Unit Type and Rh: 5100
Unit Type and Rh: 5100
Unit Type and Rh: 5100
Unit Type and Rh: 5100
Unit Type and Rh: 5100
Unit Type and Rh: 5100

## 2018-08-17 LAB — PREPARE RBC (CROSSMATCH)

## 2018-08-17 LAB — MASSIVE TRANSFUSION PROTOCOL ORDER (BLOOD BANK NOTIFICATION)

## 2018-08-17 NOTE — Telephone Encounter (Signed)
Death certificate received and placed in MD box for singing.

## 2018-08-18 NOTE — Telephone Encounter (Signed)
Death certificate completed and placed at front desk for pickup. Benjie Karvonen aware.

## 2018-08-19 LAB — CULTURE, BLOOD (ROUTINE X 2)
CULTURE: NO GROWTH
Culture: NO GROWTH
SPECIAL REQUESTS: ADEQUATE

## 2018-08-31 NOTE — ED Notes (Signed)
MD inserting central line.

## 2018-08-31 NOTE — ED Notes (Signed)
2nd unit blood finished on rapid infuser
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
## 2018-08-31 NOTE — Death Summary Note (Signed)
DEATH SUMMARY   Patient Details  Name: Stephen Fields MRN: [id number] DOB: 1932-02-22  Admission/Discharge Information   Admit Date:  08/19/2018  Date of Death: Date of Death: 2018/08/19  Time of Death: Time of Death: 28-Nov-2104  Length of Stay: 1  Referring Physician: Rusty Aus, MD   Reason(s) for Hospitalization  Acute GI bleed Septic shock secondary to aspiration pneumonia Acute respiratory failure Acute renal failure Diagnoses  Preliminary cause of death: Cardiopulmonary arrest Secondary Diagnoses (including complications and co-morbidities):  Active Problems:   GI bleed Acute gastrointestinal bleeding Shock  Sepsis/aspiration with hypovolumia  Acute respiratory failure with severe aspiration  Acute kidney failure Brief Hospital Course (including significant findings, care, treatment, and services provided and events leading to death)  Stephen Fields is a 85 y.o. year old male with a known history of GERD, prostate cancer, pulmonary embolism in the past, hypertension, hyperlipidemia was sent from [facility] assisted living facility for multiple episodes  coffee-ground emesis since yesterday. In the ER he had large coffee ground emesis with aspiration requiring intubation and mechanical ventilation. He was given blood, IVF started on pressors and transferred to ICU with GI consult. IV Protonix drip was started in [facility] gastroenterology consultation was done for endoscopy procedure and further intervention..After intubation pulse was also lost and CPR was done for less than a minute.One round of epinephrine was given.Blood pressure and pulse was revived. In the ICU he remained hypotensive requiring multiple pressors. Dr Sheela Stack discussed the pros and cons of EGD in this setting  and family decided not to proceed with EGD and were comfortable with DNR. Despite treatment, patient's condition continued to deteriorate hence he was made full comfort care and expired at Nov 28, 2104.    Pertinent Labs and Studies  Significant Diagnostic Studies Dg Abdomen 1 View  Result Date: 19-Aug-2018 CLINICAL DATA:  Orogastric tube placement EXAM: ABDOMEN - 1 VIEW COMPARISON:  Portable exam 1206 hours compared to CT abdomen and pelvis 04/21/2017 FINDINGS: Tip of nasogastric tube is coiled at the distal esophagus reflect proximally. Air-filled bowel loops in the upper abdomen. Bones demineralized. LEFT lower lobe infiltrate. IMPRESSION: Nasogastric tube is coiled in the distal esophagus with tip directed cranially, recommend repositioning. New LEFT lower lobe infiltrate question aspiration. Electronically Signed   By: Lavonia Dana M.D.   On: 08-19-2018 13:40   Dg Chest Portable 1 View  Result Date: 08-19-2018 CLINICAL DATA:  Intubation, central line placement EXAM: PORTABLE CHEST 1 VIEW COMPARISON:  Portable exam 1240 hours compared to 1100 hours FINDINGS: Tip of endotracheal tube projects 4.5 cm above carina. Nasogastric tube extends into stomach. External pacing leads project over chest. Stable heart size. Slight prominence of the mediastinum likely related to rotation to the RIGHT. Diffuse LEFT lower lobe airspace infiltrate chronic question within lingula as well, favor aspiration due to repeated the unchanged. Remaining lungs clear. No pleural effusion or pneumothorax. Bones demineralized. IMPRESSION: Tube positions as above. New LEFT lung infiltrates favor aspiration. Electronically Signed   By: Lavonia Dana M.D.   On: 08/19/18 13:39   Dg Chest Port 1 View  Result Date: 08/19/18 CLINICAL DATA:  Shortness of breath.  Coffee ground emesis. EXAM: PORTABLE CHEST 1 VIEW COMPARISON:  2016/11/28 FINDINGS: The heart size and mediastinal contours are within normal limits. Both lungs are clear. The visualized skeletal structures are unremarkable. IMPRESSION: No active disease. Electronically Signed   By: Dorise Bullion III M.D   On: 08/19/18 11:24    Microbiology Recent  Results (from  the past 240 hour(s))  MRSA PCR Screening     Status: None   Collection Time: Aug 20, 2018  1:51 PM  Result Value Ref Range Status   MRSA by PCR NEGATIVE NEGATIVE Final    Comment:        The GeneXpert MRSA Assay (FDA approved for NASAL specimens only), is one component of a comprehensive MRSA colonization surveillance program. It is not intended to diagnose MRSA infection nor to guide or monitor treatment for MRSA infections. Performed at [facility], [address] [place], [place] [address]     Lab Basic Metabolic Panel: Recent Labs  Lab 08/20/18 1055 2018-08-20 1115  NA 140 140  K 3.1* 3.0*  CL 97* 100  CO2 26  --   GLUCOSE 203* 192*  BUN 36* 35*  CREATININE 2.54* 2.50*  CALCIUM 9.4  --    Liver Function Tests: Recent Labs  Lab Aug 20, 2018 1055  AST 34  ALT 21  ALKPHOS 73  BILITOT 0.8  PROT 8.2*  ALBUMIN 4.3   No results for input(s): LIPASE, AMYLASE in the last 168 hours. No results for input(s): AMMONIA in the last 168 hours. CBC: Recent Labs  Lab August 20, 2018 1055 08/20/2018 1115 20-Aug-2018 1208 08/20/18 1449 Aug 20, 2018 1730  WBC 16.4*  --   --  6.3  --   NEUTROABS 14.4*  --   --   --   --   HGB 14.7 15.3 14.2 16.7 17.4*  HCT 45.9 45.0 45.4 53.2*  --   MCV 92.4  --   --  94.0  --   PLT 451*  --  308 348  --    Cardiac Enzymes: Recent Labs  Lab 20-Aug-2018 1105  TROPONINI <0.03   Sepsis Labs: Recent Labs  Lab 08-20-2018 1055 20-Aug-2018 1101 Aug 20, 2018 1449  WBC 16.4*  --  6.3  LATICACIDVEN  --  6.16*  --     Procedures/Operations  Intubation Femoral line?  Magdalene S. [facility] ANP-BC Pulmonary and Critical Care Medicine [facility] Pager [phone] or [phone]  NB: This document was prepared using Dragon voice recognition software and may include unintentional dictation errors.   08/07/2018, 1:12 AM

## 2018-08-31 NOTE — ED Notes (Signed)
1st unit of plasma finished.
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
## 2018-08-31 NOTE — ED Notes (Signed)
3rd unit emergency blood being started on rapid infuser.
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
## 2018-08-31 NOTE — ED Notes (Addendum)
1st unit plasma started from [facility]TP

## 2018-08-31 NOTE — ED Notes (Signed)
3rd unit emergent blood done.

## 2018-08-31 NOTE — ED Notes (Signed)
Dr Alfred Levins intubated patient. Pt started vomiting large amount coffee ground emesis around tube. Tube pulled, pt turned on side.  Dr Quentin Cornwall at bedside to assist with intubation.

## 2018-08-31 NOTE — Progress Notes (Signed)
Patient extubated to 2L [place]. Family present at bedside and updated, no questions currently. Will continue to monitor. Wilnette Kales

## 2018-08-31 NOTE — ED Triage Notes (Signed)
Pt arrives [facility] from [facility] with coffee ground emesis since yesterday and diarrhea. Pt was hypoxic with EMS at 89%, placed on 4 L. Was 84% on RA upon arrival, placed on 6L [place]. Does NOT wear O2 normally. Pale. Alert and oriented. Hypotensive and tachycardic.

## 2018-08-31 NOTE — ED Notes (Signed)
Preparing for intubation

## 2018-08-31 NOTE — ED Notes (Signed)
MD preparing for central line

## 2018-08-31 NOTE — ED Notes (Signed)
1st unit emergent blood finished.

## 2018-08-31 NOTE — ED Notes (Signed)
Blood slowed to 400 per how since hgb was stable at this time.

## 2018-08-31 NOTE — ED Notes (Signed)
Pt being transported to ICU with this RN, kate RN, RT, Zoll,

## 2018-08-31 NOTE — ED Notes (Signed)
RT starting bipap

## 2018-08-31 NOTE — ED Notes (Signed)
2nd unit emergent blood being started on rapid infuser by kate rn

## 2018-08-31 NOTE — Progress Notes (Signed)
Advanced care plan.  Purpose of the Encounter: CODE STATUS  Parties in Attendance: Patient, and patient's family which includes patient's wife and daughter  Patient's Decision Capacity: Patient intubated on ventilator  Subjective/Patient's story: Patient presented to the emergency room with massive GI bleed   Objective/Medical story Has massive upper GI bleed with hypotension and shock Aspirated Needs IV fluids, PRBC transfusion and emergent endoscopy by gastroenterology   Goals of care determination:  Advance care directives goals of care and treatment plan has been discussed with the patient's family in detail Patient's wife and daughter do not want any CPR, intubation and ventilator if there is any further cardiac arrest and respiratory arrest   CODE STATUS: DNR   Time spent discussing advanced care planning: 16 minutes

## 2018-08-31 NOTE — Progress Notes (Signed)
Patient expired at 2106. Pronounced by this nurse and Domingo Pulse Rust-Chester, RN. Jeoffrey Massed, NP notified of death. CDS notified of death, [phone] (spoke with Guerry Bruin). CCMD and AC notified of death. Wilnette Kales

## 2018-08-31 NOTE — Progress Notes (Signed)
PHARMACY NOTE -  ANTIBIOTIC RENAL DOSE ADJUSTMENT    Pharmacy to assist with antibiotic renal dose adjustment.   Patient has been initiated on Unasyn 3g IV every 6 hours.  SCr 2.5, estimated CrCl 21.3 ml/min  Current dosage is not appropriate based on current renal function.   Dose has been adjusted to Unasyn 3g IV every 12 hours.   Pharmacy will continue to monitor renal function and adjust dose as needed.    Pernell Dupre, PharmD, BCPS Clinical Pharmacist 08-31-18 2:27 PM

## 2018-08-31 NOTE — ED Notes (Addendum)
1st unit emergent blood started. Verified by Ellerie Arenz and kate rn. Started at 999 ml/hr on pump per dr Alfred Levins.
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
## 2018-08-31 NOTE — Progress Notes (Signed)
Pt extubated to 2L [place] per MD order. RN [place] at bedside.
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
## 2018-08-31 NOTE — ED Notes (Addendum)
1st unit emergent blood restarted squeezing in manually by [facility] RN

## 2018-08-31 NOTE — Progress Notes (Signed)
Patient's family present and ready for extubation as discussed by Dr. Bretta Bang. Dr. Bretta Bang notified and comfort care orders placed per verbal order. Patient on fentanyl and precedex, will extubate soon. CCMD notified.  Stephen Fields

## 2018-08-31 DEATH — deceased
